# Patient Record
Sex: Female | Born: 1940 | Race: White | Hispanic: No | Smoking: Never smoker
Health system: Southern US, Community
[De-identification: ages and names within clinical notes are randomized; demographics above are authoritative.]

## PROBLEM LIST (undated history)

## (undated) DIAGNOSIS — I10 Essential (primary) hypertension: Secondary | ICD-10-CM

## (undated) DIAGNOSIS — E079 Disorder of thyroid, unspecified: Secondary | ICD-10-CM

## (undated) DIAGNOSIS — E785 Hyperlipidemia, unspecified: Secondary | ICD-10-CM

## (undated) HISTORY — PX: BLADDER REPAIR: SHX76

## (undated) HISTORY — DX: Essential (primary) hypertension: I10

## (undated) HISTORY — PX: ABDOMINAL HYSTERECTOMY: SHX81

## (undated) HISTORY — DX: Hyperlipidemia, unspecified: E78.5

## (undated) HISTORY — DX: Disorder of thyroid, unspecified: E07.9

---

## 1998-07-13 ENCOUNTER — Other Ambulatory Visit: Admission: RE | Admit: 1998-07-13 | Discharge: 1998-07-13 | Payer: Self-pay | Admitting: Obstetrics and Gynecology

## 1999-07-27 ENCOUNTER — Other Ambulatory Visit: Admission: RE | Admit: 1999-07-27 | Discharge: 1999-07-27 | Payer: Self-pay | Admitting: Obstetrics and Gynecology

## 2000-09-04 ENCOUNTER — Other Ambulatory Visit: Admission: RE | Admit: 2000-09-04 | Discharge: 2000-09-04 | Payer: Self-pay | Admitting: Obstetrics and Gynecology

## 2001-09-08 ENCOUNTER — Encounter: Payer: Self-pay | Admitting: Obstetrics and Gynecology

## 2001-09-08 ENCOUNTER — Ambulatory Visit (HOSPITAL_COMMUNITY): Admission: RE | Admit: 2001-09-08 | Discharge: 2001-09-08 | Payer: Self-pay | Admitting: Obstetrics and Gynecology

## 2001-11-14 ENCOUNTER — Other Ambulatory Visit: Admission: RE | Admit: 2001-11-14 | Discharge: 2001-11-14 | Payer: Self-pay | Admitting: Obstetrics and Gynecology

## 2002-11-16 ENCOUNTER — Ambulatory Visit (HOSPITAL_COMMUNITY): Admission: RE | Admit: 2002-11-16 | Discharge: 2002-11-16 | Payer: Self-pay | Admitting: Obstetrics and Gynecology

## 2002-11-16 ENCOUNTER — Encounter: Payer: Self-pay | Admitting: Obstetrics and Gynecology

## 2002-12-16 ENCOUNTER — Other Ambulatory Visit: Admission: RE | Admit: 2002-12-16 | Discharge: 2002-12-16 | Payer: Self-pay | Admitting: Obstetrics and Gynecology

## 2003-12-14 ENCOUNTER — Ambulatory Visit (HOSPITAL_COMMUNITY): Admission: RE | Admit: 2003-12-14 | Discharge: 2003-12-14 | Payer: Self-pay | Admitting: Obstetrics and Gynecology

## 2005-02-15 ENCOUNTER — Ambulatory Visit (HOSPITAL_COMMUNITY): Admission: RE | Admit: 2005-02-15 | Discharge: 2005-02-15 | Payer: Self-pay | Admitting: Obstetrics and Gynecology

## 2006-03-21 ENCOUNTER — Ambulatory Visit (HOSPITAL_COMMUNITY): Admission: RE | Admit: 2006-03-21 | Discharge: 2006-03-21 | Payer: Self-pay | Admitting: Obstetrics and Gynecology

## 2007-07-21 ENCOUNTER — Ambulatory Visit (HOSPITAL_COMMUNITY): Admission: RE | Admit: 2007-07-21 | Discharge: 2007-07-21 | Payer: Self-pay | Admitting: Obstetrics and Gynecology

## 2008-07-27 ENCOUNTER — Ambulatory Visit (HOSPITAL_COMMUNITY): Admission: RE | Admit: 2008-07-27 | Discharge: 2008-07-27 | Payer: Self-pay | Admitting: Obstetrics and Gynecology

## 2009-04-05 ENCOUNTER — Encounter (INDEPENDENT_AMBULATORY_CARE_PROVIDER_SITE_OTHER): Payer: Self-pay | Admitting: *Deleted

## 2009-06-21 ENCOUNTER — Ambulatory Visit: Payer: Self-pay | Admitting: Gastroenterology

## 2009-07-05 ENCOUNTER — Encounter: Payer: Self-pay | Admitting: Gastroenterology

## 2009-07-05 ENCOUNTER — Ambulatory Visit: Payer: Self-pay | Admitting: Gastroenterology

## 2009-07-06 ENCOUNTER — Encounter: Payer: Self-pay | Admitting: Gastroenterology

## 2009-08-02 ENCOUNTER — Ambulatory Visit (HOSPITAL_COMMUNITY): Admission: RE | Admit: 2009-08-02 | Discharge: 2009-08-02 | Payer: Self-pay | Admitting: Obstetrics and Gynecology

## 2010-08-03 ENCOUNTER — Ambulatory Visit (HOSPITAL_COMMUNITY)
Admission: RE | Admit: 2010-08-03 | Discharge: 2010-08-03 | Payer: Self-pay | Source: Home / Self Care | Attending: Obstetrics and Gynecology | Admitting: Obstetrics and Gynecology

## 2011-04-17 ENCOUNTER — Encounter (INDEPENDENT_AMBULATORY_CARE_PROVIDER_SITE_OTHER): Payer: Medicare Other | Admitting: Ophthalmology

## 2011-04-17 DIAGNOSIS — H43819 Vitreous degeneration, unspecified eye: Secondary | ICD-10-CM

## 2011-04-17 DIAGNOSIS — D3130 Benign neoplasm of unspecified choroid: Secondary | ICD-10-CM

## 2011-04-17 DIAGNOSIS — H2510 Age-related nuclear cataract, unspecified eye: Secondary | ICD-10-CM

## 2011-07-16 ENCOUNTER — Other Ambulatory Visit (HOSPITAL_COMMUNITY): Payer: Self-pay | Admitting: Obstetrics and Gynecology

## 2011-07-16 DIAGNOSIS — Z139 Encounter for screening, unspecified: Secondary | ICD-10-CM

## 2011-08-06 ENCOUNTER — Ambulatory Visit (HOSPITAL_COMMUNITY)
Admission: RE | Admit: 2011-08-06 | Discharge: 2011-08-06 | Disposition: A | Discharge status: Home / Self Care | Payer: Medicare Other | Source: Ambulatory Visit | Attending: Obstetrics and Gynecology | Admitting: Obstetrics and Gynecology

## 2011-08-06 DIAGNOSIS — Z1231 Encounter for screening mammogram for malignant neoplasm of breast: Secondary | ICD-10-CM | POA: Insufficient documentation

## 2011-08-06 DIAGNOSIS — Z139 Encounter for screening, unspecified: Secondary | ICD-10-CM

## 2011-10-18 ENCOUNTER — Ambulatory Visit (INDEPENDENT_AMBULATORY_CARE_PROVIDER_SITE_OTHER): Payer: Medicare Other | Admitting: Ophthalmology

## 2011-10-18 DIAGNOSIS — H2510 Age-related nuclear cataract, unspecified eye: Secondary | ICD-10-CM

## 2011-10-18 DIAGNOSIS — D3130 Benign neoplasm of unspecified choroid: Secondary | ICD-10-CM

## 2011-10-18 DIAGNOSIS — H43819 Vitreous degeneration, unspecified eye: Secondary | ICD-10-CM

## 2012-07-01 ENCOUNTER — Other Ambulatory Visit (HOSPITAL_COMMUNITY): Payer: Self-pay | Admitting: Obstetrics and Gynecology

## 2012-07-01 DIAGNOSIS — IMO0001 Reserved for inherently not codable concepts without codable children: Secondary | ICD-10-CM

## 2012-08-07 ENCOUNTER — Ambulatory Visit (HOSPITAL_COMMUNITY): Payer: Medicare Other

## 2012-08-12 ENCOUNTER — Ambulatory Visit (HOSPITAL_COMMUNITY): Payer: Medicare Other

## 2012-08-15 ENCOUNTER — Ambulatory Visit (HOSPITAL_COMMUNITY)
Admission: RE | Admit: 2012-08-15 | Discharge: 2012-08-15 | Disposition: A | Discharge status: Home / Self Care | Payer: Medicare Other | Source: Ambulatory Visit | Attending: Obstetrics and Gynecology | Admitting: Obstetrics and Gynecology

## 2012-08-15 DIAGNOSIS — Z1231 Encounter for screening mammogram for malignant neoplasm of breast: Secondary | ICD-10-CM | POA: Insufficient documentation

## 2012-08-15 DIAGNOSIS — IMO0001 Reserved for inherently not codable concepts without codable children: Secondary | ICD-10-CM

## 2012-10-17 ENCOUNTER — Ambulatory Visit (INDEPENDENT_AMBULATORY_CARE_PROVIDER_SITE_OTHER): Payer: BC Managed Care – PPO | Admitting: Ophthalmology

## 2013-07-13 ENCOUNTER — Other Ambulatory Visit (HOSPITAL_COMMUNITY): Payer: Self-pay | Admitting: Obstetrics and Gynecology

## 2013-07-13 DIAGNOSIS — Z139 Encounter for screening, unspecified: Secondary | ICD-10-CM

## 2013-08-17 ENCOUNTER — Ambulatory Visit (HOSPITAL_COMMUNITY)
Admission: RE | Admit: 2013-08-17 | Discharge: 2013-08-17 | Disposition: A | Discharge status: Home / Self Care | Payer: Medicare Other | Source: Ambulatory Visit | Attending: Obstetrics and Gynecology | Admitting: Obstetrics and Gynecology

## 2013-08-17 DIAGNOSIS — Z1231 Encounter for screening mammogram for malignant neoplasm of breast: Secondary | ICD-10-CM | POA: Insufficient documentation

## 2013-08-17 DIAGNOSIS — Z139 Encounter for screening, unspecified: Secondary | ICD-10-CM

## 2013-08-29 DIAGNOSIS — D3131 Benign neoplasm of right choroid: Secondary | ICD-10-CM | POA: Insufficient documentation

## 2013-10-19 ENCOUNTER — Ambulatory Visit (INDEPENDENT_AMBULATORY_CARE_PROVIDER_SITE_OTHER): Payer: Medicare Other | Admitting: Ophthalmology

## 2013-10-19 DIAGNOSIS — H43819 Vitreous degeneration, unspecified eye: Secondary | ICD-10-CM

## 2013-10-19 DIAGNOSIS — D3130 Benign neoplasm of unspecified choroid: Secondary | ICD-10-CM

## 2013-10-19 DIAGNOSIS — H2510 Age-related nuclear cataract, unspecified eye: Secondary | ICD-10-CM

## 2014-10-19 ENCOUNTER — Ambulatory Visit (INDEPENDENT_AMBULATORY_CARE_PROVIDER_SITE_OTHER): Payer: Medicare Other | Admitting: Ophthalmology

## 2014-10-19 DIAGNOSIS — D3131 Benign neoplasm of right choroid: Secondary | ICD-10-CM | POA: Diagnosis not present

## 2014-10-19 DIAGNOSIS — H43813 Vitreous degeneration, bilateral: Secondary | ICD-10-CM

## 2014-10-28 ENCOUNTER — Other Ambulatory Visit: Payer: Self-pay | Admitting: Obstetrics and Gynecology

## 2014-10-28 DIAGNOSIS — Z1231 Encounter for screening mammogram for malignant neoplasm of breast: Secondary | ICD-10-CM

## 2014-11-01 ENCOUNTER — Ambulatory Visit (HOSPITAL_COMMUNITY)
Admission: RE | Admit: 2014-11-01 | Discharge: 2014-11-01 | Disposition: A | Discharge status: Home / Self Care | Payer: Medicare Other | Source: Ambulatory Visit | Attending: Obstetrics and Gynecology | Admitting: Obstetrics and Gynecology

## 2014-11-01 DIAGNOSIS — Z1231 Encounter for screening mammogram for malignant neoplasm of breast: Secondary | ICD-10-CM | POA: Diagnosis present

## 2015-10-20 ENCOUNTER — Ambulatory Visit (INDEPENDENT_AMBULATORY_CARE_PROVIDER_SITE_OTHER): Payer: Medicare Other | Admitting: Ophthalmology

## 2015-11-10 ENCOUNTER — Ambulatory Visit (INDEPENDENT_AMBULATORY_CARE_PROVIDER_SITE_OTHER): Payer: Medicare Other | Admitting: Ophthalmology

## 2015-11-10 DIAGNOSIS — H43813 Vitreous degeneration, bilateral: Secondary | ICD-10-CM

## 2015-11-10 DIAGNOSIS — H2513 Age-related nuclear cataract, bilateral: Secondary | ICD-10-CM | POA: Diagnosis not present

## 2015-11-10 DIAGNOSIS — D3131 Benign neoplasm of right choroid: Secondary | ICD-10-CM

## 2015-11-18 ENCOUNTER — Other Ambulatory Visit (HOSPITAL_COMMUNITY): Payer: Self-pay | Admitting: Obstetrics and Gynecology

## 2015-11-18 DIAGNOSIS — Z1231 Encounter for screening mammogram for malignant neoplasm of breast: Secondary | ICD-10-CM

## 2015-11-24 ENCOUNTER — Ambulatory Visit (HOSPITAL_COMMUNITY)
Admission: RE | Admit: 2015-11-24 | Discharge: 2015-11-24 | Disposition: A | Discharge status: Home / Self Care | Payer: Medicare Other | Source: Ambulatory Visit | Attending: Obstetrics and Gynecology | Admitting: Obstetrics and Gynecology

## 2015-11-24 DIAGNOSIS — Z1231 Encounter for screening mammogram for malignant neoplasm of breast: Secondary | ICD-10-CM | POA: Diagnosis not present

## 2015-12-05 DIAGNOSIS — E039 Hypothyroidism, unspecified: Secondary | ICD-10-CM | POA: Diagnosis not present

## 2015-12-05 DIAGNOSIS — Z01419 Encounter for gynecological examination (general) (routine) without abnormal findings: Secondary | ICD-10-CM | POA: Diagnosis not present

## 2015-12-05 DIAGNOSIS — Z6832 Body mass index (BMI) 32.0-32.9, adult: Secondary | ICD-10-CM | POA: Diagnosis not present

## 2015-12-05 DIAGNOSIS — R5383 Other fatigue: Secondary | ICD-10-CM | POA: Diagnosis not present

## 2016-01-25 ENCOUNTER — Encounter: Payer: Self-pay | Admitting: Gastroenterology

## 2016-02-27 DIAGNOSIS — E039 Hypothyroidism, unspecified: Secondary | ICD-10-CM | POA: Diagnosis not present

## 2016-07-12 DIAGNOSIS — D3131 Benign neoplasm of right choroid: Secondary | ICD-10-CM | POA: Diagnosis not present

## 2016-11-13 ENCOUNTER — Ambulatory Visit (INDEPENDENT_AMBULATORY_CARE_PROVIDER_SITE_OTHER): Payer: Medicare Other | Admitting: Ophthalmology

## 2016-12-03 ENCOUNTER — Other Ambulatory Visit (HOSPITAL_COMMUNITY): Payer: Self-pay | Admitting: Obstetrics and Gynecology

## 2016-12-03 DIAGNOSIS — Z1231 Encounter for screening mammogram for malignant neoplasm of breast: Secondary | ICD-10-CM

## 2016-12-05 ENCOUNTER — Ambulatory Visit (HOSPITAL_COMMUNITY)
Admission: RE | Admit: 2016-12-05 | Discharge: 2016-12-05 | Disposition: A | Discharge status: Home / Self Care | Payer: Medicare Other | Source: Ambulatory Visit | Attending: Obstetrics and Gynecology | Admitting: Obstetrics and Gynecology

## 2016-12-05 DIAGNOSIS — Z1231 Encounter for screening mammogram for malignant neoplasm of breast: Secondary | ICD-10-CM | POA: Insufficient documentation

## 2016-12-17 DIAGNOSIS — M8588 Other specified disorders of bone density and structure, other site: Secondary | ICD-10-CM | POA: Diagnosis not present

## 2016-12-17 DIAGNOSIS — Z01419 Encounter for gynecological examination (general) (routine) without abnormal findings: Secondary | ICD-10-CM | POA: Diagnosis not present

## 2016-12-17 DIAGNOSIS — E039 Hypothyroidism, unspecified: Secondary | ICD-10-CM | POA: Diagnosis not present

## 2016-12-17 DIAGNOSIS — N958 Other specified menopausal and perimenopausal disorders: Secondary | ICD-10-CM | POA: Diagnosis not present

## 2016-12-17 DIAGNOSIS — Z6831 Body mass index (BMI) 31.0-31.9, adult: Secondary | ICD-10-CM | POA: Diagnosis not present

## 2017-01-31 ENCOUNTER — Ambulatory Visit (INDEPENDENT_AMBULATORY_CARE_PROVIDER_SITE_OTHER): Payer: Medicare Other | Admitting: Ophthalmology

## 2017-01-31 DIAGNOSIS — H43813 Vitreous degeneration, bilateral: Secondary | ICD-10-CM | POA: Diagnosis not present

## 2017-01-31 DIAGNOSIS — H2513 Age-related nuclear cataract, bilateral: Secondary | ICD-10-CM | POA: Diagnosis not present

## 2017-01-31 DIAGNOSIS — D3131 Benign neoplasm of right choroid: Secondary | ICD-10-CM | POA: Diagnosis not present

## 2017-08-09 DIAGNOSIS — H11002 Unspecified pterygium of left eye: Secondary | ICD-10-CM | POA: Diagnosis not present

## 2017-08-09 DIAGNOSIS — H2513 Age-related nuclear cataract, bilateral: Secondary | ICD-10-CM | POA: Diagnosis not present

## 2017-08-09 DIAGNOSIS — D3131 Benign neoplasm of right choroid: Secondary | ICD-10-CM | POA: Diagnosis not present

## 2017-12-26 ENCOUNTER — Other Ambulatory Visit (HOSPITAL_COMMUNITY): Payer: Self-pay | Admitting: Obstetrics and Gynecology

## 2017-12-26 DIAGNOSIS — Z1231 Encounter for screening mammogram for malignant neoplasm of breast: Secondary | ICD-10-CM

## 2017-12-27 ENCOUNTER — Ambulatory Visit (HOSPITAL_COMMUNITY)
Admission: RE | Admit: 2017-12-27 | Discharge: 2017-12-27 | Disposition: A | Discharge status: Home / Self Care | Payer: Medicare Other | Source: Ambulatory Visit | Attending: Obstetrics and Gynecology | Admitting: Obstetrics and Gynecology

## 2017-12-27 DIAGNOSIS — Z1231 Encounter for screening mammogram for malignant neoplasm of breast: Secondary | ICD-10-CM | POA: Insufficient documentation

## 2018-01-08 DIAGNOSIS — N3945 Continuous leakage: Secondary | ICD-10-CM | POA: Diagnosis not present

## 2018-01-08 DIAGNOSIS — Z01419 Encounter for gynecological examination (general) (routine) without abnormal findings: Secondary | ICD-10-CM | POA: Diagnosis not present

## 2018-01-08 DIAGNOSIS — Z1329 Encounter for screening for other suspected endocrine disorder: Secondary | ICD-10-CM | POA: Diagnosis not present

## 2018-01-08 DIAGNOSIS — Z6831 Body mass index (BMI) 31.0-31.9, adult: Secondary | ICD-10-CM | POA: Diagnosis not present

## 2018-01-08 DIAGNOSIS — E039 Hypothyroidism, unspecified: Secondary | ICD-10-CM | POA: Diagnosis not present

## 2018-01-13 ENCOUNTER — Telehealth: Payer: Self-pay | Admitting: *Deleted

## 2018-01-13 DIAGNOSIS — Z13220 Encounter for screening for lipoid disorders: Secondary | ICD-10-CM

## 2018-01-13 DIAGNOSIS — Z79899 Other long term (current) drug therapy: Secondary | ICD-10-CM

## 2018-01-13 NOTE — Telephone Encounter (Signed)
Patient is aware 

## 2018-01-13 NOTE — Addendum Note (Signed)
Addended by: Karle Barr on: 01/13/2018 02:15 PM   Modules accepted: Orders

## 2018-01-13 NOTE — Telephone Encounter (Signed)
Met 7 lip liv 

## 2018-01-13 NOTE — Telephone Encounter (Signed)
I spoke with the pt and she states she has had some blood pressure readings around 150/90 and she is having some headaches as of late. She was given the choice of being seen this week on Wednesday,but passed on that as her husband has an appt that day. She is scheduled on next Tuesday as that would work best for her. She also wants blood work drawn prior to this appt. No labs were drawn on her in the past with [place].Please advise.

## 2018-01-13 NOTE — Telephone Encounter (Signed)
I tried calling home husband [employer] says she is not there. I called her cell number and left a message to ask that she r/c to see what the bp are running and if headaches or symptomatic.No blood work has been drawn on her in the past.

## 2018-01-13 NOTE — Telephone Encounter (Signed)
Patient called stated she seen her GYN last week and he told her to schedule an office visit for high blood pressure patient scheduled for next Tuesday due to her husband having infusions this week. Patient stated she was also told to have blood work done such as cholesterol and blood sugar, patient stated it has been a while since labs were checked. Pease advise

## 2018-01-16 DIAGNOSIS — Z79899 Other long term (current) drug therapy: Secondary | ICD-10-CM | POA: Diagnosis not present

## 2018-01-16 DIAGNOSIS — Z13220 Encounter for screening for lipoid disorders: Secondary | ICD-10-CM | POA: Diagnosis not present

## 2018-01-17 LAB — BASIC METABOLIC PANEL
BUN/Creatinine Ratio: 14 (ref 12–28)
BUN: 11 mg/dL (ref 8–27)
CO2: 26 mmol/L (ref 20–29)
Calcium: 9.6 mg/dL (ref 8.7–10.3)
Chloride: 100 mmol/L (ref 96–106)
Creatinine, Ser: 0.81 mg/dL (ref 0.57–1.00)
GFR calc Af Amer: 83 mL/min/{1.73_m2} (ref >59)
GFR calc non Af Amer: 72 mL/min/{1.73_m2} (ref >59)
GLUCOSE: 120 mg/dL — AB (ref 65–99)
POTASSIUM: 5.3 mmol/L — AB (ref 3.5–5.2)
SODIUM: 141 mmol/L (ref 134–144)

## 2018-01-17 LAB — HEPATIC FUNCTION PANEL
ALT: 16 IU/L (ref 0–32)
AST: 14 IU/L (ref 0–40)
Albumin: 4.2 g/dL (ref 3.5–4.8)
Alkaline Phosphatase: 93 IU/L (ref 39–117)
BILIRUBIN TOTAL: 0.4 mg/dL (ref 0.0–1.2)
Bilirubin, Direct: 0.1 mg/dL (ref 0.00–0.40)
Total Protein: 5.9 g/dL — ABNORMAL LOW (ref 6.0–8.5)

## 2018-01-17 LAB — LIPID PANEL
CHOLESTEROL TOTAL: 214 mg/dL — AB (ref 100–199)
Chol/HDL Ratio: 4.7 ratio — ABNORMAL HIGH (ref 0.0–4.4)
HDL: 46 mg/dL (ref >39)
LDL Calculated: 132 mg/dL — ABNORMAL HIGH (ref 0–99)
TRIGLYCERIDES: 178 mg/dL — AB (ref 0–149)
VLDL Cholesterol Cal: 36 mg/dL (ref 5–40)

## 2018-01-21 ENCOUNTER — Ambulatory Visit (INDEPENDENT_AMBULATORY_CARE_PROVIDER_SITE_OTHER): Payer: Medicare Other | Admitting: Family Medicine

## 2018-01-21 ENCOUNTER — Encounter: Payer: Self-pay | Admitting: Family Medicine

## 2018-01-21 DIAGNOSIS — E785 Hyperlipidemia, unspecified: Secondary | ICD-10-CM

## 2018-01-21 DIAGNOSIS — I10 Essential (primary) hypertension: Secondary | ICD-10-CM

## 2018-01-21 DIAGNOSIS — E039 Hypothyroidism, unspecified: Secondary | ICD-10-CM

## 2018-01-21 MED ORDER — ENALAPRIL MALEATE 5 MG PO TABS: ORAL_TABLET | ORAL | 1 refills | Status: DC

## 2018-01-21 NOTE — Patient Instructions (Signed)
[facility] omron 5 fairly accurate  [employer] lifesource   Both of these monitors are good   High Cholesterol High cholesterol is a condition in which the blood has high levels of a white, waxy, fat-like substance (cholesterol). The human body needs small amounts of cholesterol. The liver makes all the cholesterol that the body needs. Extra (excess) cholesterol comes from the food that we eat. Cholesterol is carried from the liver by the blood through the blood vessels. If you have high cholesterol, deposits (plaques) may build up on the walls of your blood vessels (arteries). Plaques make the arteries narrower and stiffer. Cholesterol plaques increase your risk for heart attack and stroke. Work with your health care provider to keep your cholesterol levels in a healthy range. What increases the risk? This condition is more likely to develop in people who:  Eat foods that are high in animal fat (saturated fat) or cholesterol.  Are overweight.  Are not getting enough exercise.  Have a family history of high cholesterol.  What are the signs or symptoms? There are no symptoms of this condition. How is this diagnosed? This condition may be diagnosed from the results of a blood test.  If you are older than age 30, your health care provider may check your cholesterol every 4-6 years.  You may be checked more often if you already have high cholesterol or other risk factors for heart disease.  The blood test for cholesterol measures:  "Bad" cholesterol (LDL cholesterol). This is the main type of cholesterol that causes heart disease. The desired level for LDL is less than 100.  "Good" cholesterol (HDL cholesterol). This type helps to protect against heart disease by cleaning the arteries and carrying the LDL away. The desired level for HDL is 60 or higher.  Triglycerides. These are fats that the body can store or burn for energy. The desired number for triglycerides is lower than  150.  Total cholesterol. This is a measure of the total amount of cholesterol in your blood, including LDL cholesterol, HDL cholesterol, and triglycerides. A healthy number is less than 200.  How is this treated? This condition is treated with diet changes, lifestyle changes, and medicines. Diet changes  This may include eating more whole grains, fruits, vegetables, nuts, and fish.  This may also include cutting back on red meat and foods that have a lot of added sugar. Lifestyle changes  Changes may include getting at least 40 minutes of aerobic exercise 3 times a week. Aerobic exercises include walking, biking, and swimming. Aerobic exercise along with a healthy diet can help you maintain a healthy weight.  Changes may also include quitting smoking. Medicines  Medicines are usually given if diet and lifestyle changes have failed to reduce your cholesterol to healthy levels.  Your health care provider may prescribe a statin medicine. Statin medicines have been shown to reduce cholesterol, which can reduce the risk of heart disease. Follow these instructions at home: Eating and drinking  If told by your health care provider:  Eat chicken (without skin), fish, veal, shellfish, ground [place] breast, and round or loin cuts of red meat.  Do not eat fried foods or fatty meats, such as hot dogs and salami.  Eat plenty of fruits, such as apples.  Eat plenty of vegetables, such as broccoli, potatoes, and carrots.  Eat beans, peas, and lentils.  Eat grains such as barley, rice, couscous, and bulgur wheat.  Eat pasta without cream sauces.  Use skim or nonfat  milk, and eat low-fat or nonfat yogurt and cheeses.  Do not eat or drink whole milk, cream, ice cream, egg yolks, or hard cheeses.  Do not eat stick margarine or tub margarines that contain trans fats (also called partially hydrogenated oils).  Do not eat saturated tropical oils, such as coconut oil and palm oil.  Do not eat  cakes, cookies, crackers, or other baked goods that contain trans fats.  General instructions  Exercise as directed by your health care provider. Increase your activity level with activities such as gardening, walking, and taking the stairs.  Take over-the-counter and prescription medicines only as told by your health care provider.  Do not use any products that contain nicotine or tobacco, such as cigarettes and e-cigarettes. If you need help quitting, ask your health care provider.  Keep all follow-up visits as told by your health care provider. This is important. Contact a health care provider if:  You are struggling to maintain a healthy diet or weight.  You need help to start on an exercise program.  You need help to stop smoking. Get help right away if:  You have chest pain.  You have trouble breathing. This information is not intended to replace advice given to you by your health care provider. Make sure you discuss any questions you have with your health care provider. Document Released: 08/13/2005 Document Revised: 03/10/2016 Document Reviewed: 02/11/2016 Elsevier Interactive Patient Education  2018 [employer].  Hypertension Hypertension, commonly called high blood pressure, is when the force of blood pumping through the arteries is too strong. The arteries are the blood vessels that carry blood from the heart throughout the body. Hypertension forces the heart to work harder to pump blood and may cause arteries to become narrow or stiff. Having untreated or uncontrolled hypertension can cause heart attacks, strokes, kidney disease, and other problems. A blood pressure reading consists of a higher number over a lower number. Ideally, your blood pressure should be below 120/80. The first ("top") number is called the systolic pressure. It is a measure of the pressure in your arteries as your heart beats. The second ("bottom") number is called the diastolic pressure. It is a  measure of the pressure in your arteries as the heart relaxes. What are the causes? The cause of this condition is not known. What increases the risk? Some risk factors for high blood pressure are under your control. Others are not. Factors you can change  Smoking.  Having type 2 diabetes mellitus, high cholesterol, or both.  Not getting enough exercise or physical activity.  Being overweight.  Having too much fat, sugar, calories, or salt (sodium) in your diet.  Drinking too much alcohol. Factors that are difficult or impossible to change  Having chronic kidney disease.  Having a family history of high blood pressure.  Age. Risk increases with age.  Race. You may be at higher risk if you are African-American.  Gender. Men are at higher risk than women before age 62. After age 68, women are at higher risk than men.  Having obstructive sleep apnea.  Stress. What are the signs or symptoms? Extremely high blood pressure (hypertensive crisis) may cause:  Headache.  Anxiety.  Shortness of breath.  Nosebleed.  Nausea and vomiting.  Severe chest pain.  Jerky movements you cannot control (seizures).  How is this diagnosed? This condition is diagnosed by measuring your blood pressure while you are seated, with your arm resting on a surface. The cuff of the blood  pressure monitor will be placed directly against the skin of your upper arm at the level of your heart. It should be measured at least twice using the same arm. Certain conditions can cause a difference in blood pressure between your right and left arms. Certain factors can cause blood pressure readings to be lower or higher than normal (elevated) for a short period of time:  When your blood pressure is higher when you are in a health care provider's office than when you are at home, this is called white coat hypertension. Most people with this condition do not need medicines.  When your blood pressure is higher  at home than when you are in a health care provider's office, this is called masked hypertension. Most people with this condition may need medicines to control blood pressure.  If you have a high blood pressure reading during one visit or you have normal blood pressure with other risk factors:  You may be asked to return on a different day to have your blood pressure checked again.  You may be asked to monitor your blood pressure at home for 1 week or longer.  If you are diagnosed with hypertension, you may have other blood or imaging tests to help your health care provider understand your overall risk for other conditions. How is this treated? This condition is treated by making healthy lifestyle changes, such as eating healthy foods, exercising more, and reducing your alcohol intake. Your health care provider may prescribe medicine if lifestyle changes are not enough to get your blood pressure under control, and if:  Your systolic blood pressure is above 130.  Your diastolic blood pressure is above 80.  Your personal target blood pressure may vary depending on your medical conditions, your age, and other factors. Follow these instructions at home: Eating and drinking  Eat a diet that is high in fiber and potassium, and low in sodium, added sugar, and fat. An example eating plan is called the DASH (Dietary Approaches to Stop Hypertension) diet. To eat this way: ? Eat plenty of fresh fruits and vegetables. Try to fill half of your plate at each meal with fruits and vegetables. ? Eat whole grains, such as whole wheat pasta, brown rice, or whole grain bread. Fill about one quarter of your plate with whole grains. ? Eat or drink low-fat dairy products, such as skim milk or low-fat yogurt. ? Avoid fatty cuts of meat, processed or cured meats, and poultry with skin. Fill about one quarter of your plate with lean proteins, such as fish, chicken without skin, beans, eggs, and tofu. ? Avoid premade  and processed foods. These tend to be higher in sodium, added sugar, and fat.  Reduce your daily sodium intake. Most people with hypertension should eat less than 1,500 mg of sodium a day.  Limit alcohol intake to no more than 1 drink a day for nonpregnant women and 2 drinks a day for men. One drink equals 12 oz of beer, 5 oz of wine, or 1 oz of hard liquor. Lifestyle  Work with your health care provider to maintain a healthy body weight or to lose weight. Ask what an ideal weight is for you.  Get at least 30 minutes of exercise that causes your heart to beat faster (aerobic exercise) most days of the week. Activities may include walking, swimming, or biking.  Include exercise to strengthen your muscles (resistance exercise), such as pilates or lifting weights, as part of your weekly exercise routine.  Try to do these types of exercises for 30 minutes at least 3 days a week.  Do not use any products that contain nicotine or tobacco, such as cigarettes and e-cigarettes. If you need help quitting, ask your health care provider.  Monitor your blood pressure at home as told by your health care provider.  Keep all follow-up visits as told by your health care provider. This is important. Medicines  Take over-the-counter and prescription medicines only as told by your health care provider. Follow directions carefully. Blood pressure medicines must be taken as prescribed.  Do not skip doses of blood pressure medicine. Doing this puts you at risk for problems and can make the medicine less effective.  Ask your health care provider about side effects or reactions to medicines that you should watch for. Contact a health care provider if:  You think you are having a reaction to a medicine you are taking.  You have headaches that keep coming back (recurring).  You feel dizzy.  You have swelling in your ankles.  You have trouble with your vision. Get help right away if:  You develop a severe  headache or confusion.  You have unusual weakness or numbness.  You feel faint.  You have severe pain in your chest or abdomen.  You vomit repeatedly.  You have trouble breathing. Summary  Hypertension is when the force of blood pumping through your arteries is too strong. If this condition is not controlled, it may put you at risk for serious complications.  Your personal target blood pressure may vary depending on your medical conditions, your age, and other factors. For most people, a normal blood pressure is less than 120/80.  Hypertension is treated with lifestyle changes, medicines, or a combination of both. Lifestyle changes include weight loss, eating a healthy, low-sodium diet, exercising more, and limiting alcohol. This information is not intended to replace advice given to you by your health care provider. Make sure you discuss any questions you have with your health care provider. Document Released: 08/13/2005 Document Revised: 07/11/2016 Document Reviewed: 07/11/2016 Elsevier Interactive Patient Education  [employer].

## 2018-01-21 NOTE — Progress Notes (Signed)
   Subjective:    Patient ID: Meghan Ortiz, female    DOB: April 19, 1941, 77 y.o.   MRN: [id number] Patient arrives after a multi-year hiatus, she has been obtain all of her health care elsewhere with OB/GYN.  Hypertension   This is a new problem. Associated symptoms include headaches.  Pt has been taking care of husband for the past year. Pt seen OB and her BP was 150/90   BP up lately, in past numberw have been good  BP elev last winter   Pt can hear her heart beat at times in her head  No fam hx of high blood pressure  stong fam yrs   On effexor for stress and anxiety    Pt under a lot of stress sith husb sick and with lung cancer  Pt had exercised reg unitl pst yr beaus of theis    Bowels better, taking more awate into the diet    No hi b p issues in the past    110 three yrs ago   Review of Systems  Neurological: Positive for headaches.       Objective:   Physical Exam  Alert and oriented, vitals reviewed and stable, NAD ENT-TM's and ext canals WNL bilat via otoscopic exam Soft palate, tonsils and post pharynx WNL via oropharyngeal exam Neck-symmetric, no masses; thyroid nonpalpable and nontender Pulmonary-no tachypnea or accessory muscle use; Clear without wheezes via auscultation Card--no abnrml murmurs, rhythm reg and rate WNL Carotid pulses symmetric, without bruits       Assessment & Plan:  Impression hypertension.  Exacerbated by stress.  Long discussion held.  Time to get on with medication.  Choices for home monitoring discussed at length.  Blood work discussed.  Hyperlipidemia present.  Educational information given.  Diet exercise encouraged.  Certainly very challenging for patient with husband facing lung cancer and progressive dementia.  Certainly adding to blood pressure issues.  Follow-up as scheduled.     Greater than 50% of this 30 minute face to face visit was spent in counseling and discussion and coordination of care regarding the  above diagnosis/diagnosies

## 2018-01-22 DIAGNOSIS — E039 Hypothyroidism, unspecified: Secondary | ICD-10-CM | POA: Insufficient documentation

## 2018-01-22 DIAGNOSIS — E785 Hyperlipidemia, unspecified: Secondary | ICD-10-CM | POA: Insufficient documentation

## 2018-01-22 DIAGNOSIS — I10 Essential (primary) hypertension: Secondary | ICD-10-CM | POA: Insufficient documentation

## 2018-02-05 ENCOUNTER — Ambulatory Visit (INDEPENDENT_AMBULATORY_CARE_PROVIDER_SITE_OTHER): Payer: Medicare Other | Admitting: Ophthalmology

## 2018-02-05 DIAGNOSIS — H2513 Age-related nuclear cataract, bilateral: Secondary | ICD-10-CM | POA: Diagnosis not present

## 2018-02-05 DIAGNOSIS — H43813 Vitreous degeneration, bilateral: Secondary | ICD-10-CM

## 2018-02-05 DIAGNOSIS — D3131 Benign neoplasm of right choroid: Secondary | ICD-10-CM

## 2018-03-04 DIAGNOSIS — H04123 Dry eye syndrome of bilateral lacrimal glands: Secondary | ICD-10-CM | POA: Diagnosis not present

## 2018-03-04 DIAGNOSIS — H11002 Unspecified pterygium of left eye: Secondary | ICD-10-CM | POA: Diagnosis not present

## 2018-03-04 DIAGNOSIS — H01009 Unspecified blepharitis unspecified eye, unspecified eyelid: Secondary | ICD-10-CM | POA: Diagnosis not present

## 2018-03-20 ENCOUNTER — Encounter: Payer: Self-pay | Admitting: Family Medicine

## 2018-03-20 ENCOUNTER — Ambulatory Visit (INDEPENDENT_AMBULATORY_CARE_PROVIDER_SITE_OTHER): Payer: Medicare Other | Admitting: Family Medicine

## 2018-03-20 VITALS — BP 134/74 | Ht 63.5 in | Wt 177.4 lb

## 2018-03-20 DIAGNOSIS — R7303 Prediabetes: Secondary | ICD-10-CM | POA: Diagnosis not present

## 2018-03-20 DIAGNOSIS — E785 Hyperlipidemia, unspecified: Secondary | ICD-10-CM | POA: Diagnosis not present

## 2018-03-20 DIAGNOSIS — I10 Essential (primary) hypertension: Secondary | ICD-10-CM

## 2018-03-20 NOTE — Patient Instructions (Addendum)
[employer] bp monitoring systems 100 dolars Prediabetes Eating Plan Prediabetes-also called impaired glucose tolerance or impaired fasting glucose-is a condition that causes blood sugar (blood glucose) levels to be higher than normal. Following a healthy diet can help to keep prediabetes under control. It can also help to lower the risk of type 2 diabetes and heart disease, which are increased in people who have prediabetes. Along with regular exercise, a healthy diet:  Promotes weight loss.  Helps to control blood sugar levels.  Helps to improve the way that the body uses insulin.  What do I need to know about this eating plan?  Use the glycemic index (GI) to plan your meals. The index tells you how quickly a food will raise your blood sugar. Choose low-GI foods. These foods take a longer time to raise blood sugar.  Pay close attention to the amount of carbohydrates in the food that you eat. Carbohydrates increase blood sugar levels.  Keep track of how many calories you take in. Eating the right amount of calories will help you to achieve a healthy weight. Losing about 7 percent of your starting weight can help to prevent type 2 diabetes.  You may want to follow a Mediterranean diet. This diet includes a lot of vegetables, lean meats or fish, whole grains, fruits, and healthy oils and fats. What foods can I eat? Grains Whole grains, such as whole-wheat or whole-grain breads, crackers, cereals, and pasta. Unsweetened oatmeal. Bulgur. Barley. Quinoa. Brown rice. Corn or whole-wheat flour tortillas or taco shells. Vegetables Lettuce. Spinach. Peas. Beets. Cauliflower. Cabbage. Broccoli. Carrots. Tomatoes. Squash. Eggplant. Herbs. Peppers. Onions. Cucumbers. Brussels sprouts. Fruits Berries. Bananas. Apples. Oranges. Grapes. Papaya. Mango. Pomegranate. Kiwi. Grapefruit. Cherries. Meats and Other Protein Sources Seafood. Lean meats, such as chicken and [place] or lean cuts of pork and  beef. Tofu. Eggs. Nuts. Beans. Dairy Low-fat or fat-free dairy products, such as yogurt, cottage cheese, and cheese. Beverages Water. Tea. Coffee. Sugar-free or diet soda. Seltzer water. Milk. Milk alternatives, such as soy or almond milk. Condiments Mustard. Relish. Low-fat, low-sugar ketchup. Low-fat, low-sugar barbecue sauce. Low-fat or fat-free mayonnaise. Sweets and Desserts Sugar-free or low-fat pudding. Sugar-free or low-fat ice cream and other frozen treats. Fats and Oils Avocado. Walnuts. Olive oil. The items listed above may not be a complete list of recommended foods or beverages. Contact your dietitian for more options. What foods are not recommended? Grains Refined white flour and flour products, such as bread, pasta, snack foods, and cereals. Beverages Sweetened drinks, such as sweet iced tea and soda. Sweets and Desserts Baked goods, such as cake, cupcakes, pastries, cookies, and cheesecake. The items listed above may not be a complete list of foods and beverages to avoid. Contact your dietitian for more information. This information is not intended to replace advice given to you by your health care provider. Make sure you discuss any questions you have with your health care provider. Document Released: 12/28/2014 Document Revised: 01/19/2016 Document Reviewed: 09/08/2014 Elsevier Interactive Patient Education  2017 [employer].  High Cholesterol High cholesterol is a condition in which the blood has high levels of a white, waxy, fat-like substance (cholesterol). The human body needs small amounts of cholesterol. The liver makes all the cholesterol that the body needs. Extra (excess) cholesterol comes from the food that we eat. Cholesterol is carried from the liver by the blood through the blood vessels. If you have high cholesterol, deposits (plaques) may build up on the walls of your blood vessels (  arteries). Plaques make the arteries narrower and stiffer. Cholesterol  plaques increase your risk for heart attack and stroke. Work with your health care provider to keep your cholesterol levels in a healthy range. What increases the risk? This condition is more likely to develop in people who:  Eat foods that are high in animal fat (saturated fat) or cholesterol.  Are overweight.  Are not getting enough exercise.  Have a family history of high cholesterol.  What are the signs or symptoms? There are no symptoms of this condition. How is this diagnosed? This condition may be diagnosed from the results of a blood test.  If you are older than age 70, your health care provider may check your cholesterol every 4-6 years.  You may be checked more often if you already have high cholesterol or other risk factors for heart disease.  The blood test for cholesterol measures:  "Bad" cholesterol (LDL cholesterol). This is the main type of cholesterol that causes heart disease. The desired level for LDL is less than 100.  "Good" cholesterol (HDL cholesterol). This type helps to protect against heart disease by cleaning the arteries and carrying the LDL away. The desired level for HDL is 60 or higher.  Triglycerides. These are fats that the body can store or burn for energy. The desired number for triglycerides is lower than 150.  Total cholesterol. This is a measure of the total amount of cholesterol in your blood, including LDL cholesterol, HDL cholesterol, and triglycerides. A healthy number is less than 200.  How is this treated? This condition is treated with diet changes, lifestyle changes, and medicines. Diet changes  This may include eating more whole grains, fruits, vegetables, nuts, and fish.  This may also include cutting back on red meat and foods that have a lot of added sugar. Lifestyle changes  Changes may include getting at least 40 minutes of aerobic exercise 3 times a week. Aerobic exercises include walking, biking, and swimming. Aerobic  exercise along with a healthy diet can help you maintain a healthy weight.  Changes may also include quitting smoking. Medicines  Medicines are usually given if diet and lifestyle changes have failed to reduce your cholesterol to healthy levels.  Your health care provider may prescribe a statin medicine. Statin medicines have been shown to reduce cholesterol, which can reduce the risk of heart disease. Follow these instructions at home: Eating and drinking  If told by your health care provider:  Eat chicken (without skin), fish, veal, shellfish, ground [place] breast, and round or loin cuts of red meat.  Do not eat fried foods or fatty meats, such as hot dogs and salami.  Eat plenty of fruits, such as apples.  Eat plenty of vegetables, such as broccoli, potatoes, and carrots.  Eat beans, peas, and lentils.  Eat grains such as barley, rice, couscous, and bulgur wheat.  Eat pasta without cream sauces.  Use skim or nonfat milk, and eat low-fat or nonfat yogurt and cheeses.  Do not eat or drink whole milk, cream, ice cream, egg yolks, or hard cheeses.  Do not eat stick margarine or tub margarines that contain trans fats (also called partially hydrogenated oils).  Do not eat saturated tropical oils, such as coconut oil and palm oil.  Do not eat cakes, cookies, crackers, or other baked goods that contain trans fats.  General instructions  Exercise as directed by your health care provider. Increase your activity level with activities such as gardening, walking, and taking the  stairs.  Take over-the-counter and prescription medicines only as told by your health care provider.  Do not use any products that contain nicotine or tobacco, such as cigarettes and e-cigarettes. If you need help quitting, ask your health care provider.  Keep all follow-up visits as told by your health care provider. This is important. Contact a health care provider if:  You are struggling to maintain a  healthy diet or weight.  You need help to start on an exercise program.  You need help to stop smoking. Get help right away if:  You have chest pain.  You have trouble breathing. This information is not intended to replace advice given to you by your health care provider. Make sure you discuss any questions you have with your health care provider. Document Released: 08/13/2005 Document Revised: 03/10/2016 Document Reviewed: 02/11/2016 Elsevier Interactive Patient Education  [employer].

## 2018-03-20 NOTE — Progress Notes (Signed)
   Subjective:    Patient ID: Meghan Ortiz, female    DOB: 06-Apr-1941, 77 y.o.   MRN: [id number]  Hyperlipidemia  This is a chronic problem. There are no compliance problems.   Hypertension  This is a chronic problem. There are no compliance problems.    Pt here for 2 month follow up. Pt was seen in May for HTN. Pt has brought her home BP machine (152/76 in right arm). Pt takes BP at about same time each night.   BP numbers 130 over 65  Right 128 over 59   nunbers geernally good    Chol a bit on the high side in the past  Glucose 110 three yrs ago.  Patient aware sugar is initiated.  Review of Systems No headache, no major weight loss or weight gain, no chest pain no back pain abdominal pain no change in bowel habits complete ROS otherwise negative     Objective:   Physical Exam Alert vitals stable, NAD. Blood pressure good on repeat. HEENT normal. Lungs clear. Heart regular rate and rhythm.        Assessment & Plan:  Impression hypertension.  Good control.  Discussed.  To maintain same medication.  Hyperlipidemia discussed.  Educational information given.  Appropriate measures recommended.  3.  Elevated fasting sugar.  Now 120.  This constitutes prediabetes.  Discussed with patient.  4.  Stress.  Ongoing.  Husband has been quite sick with chronic illness and cancer  Follow-up in 6 months.  Maintain same meds.  Diet exercise discussed.  Work on diet also particularly hard compliance with meds encourage

## 2018-03-21 ENCOUNTER — Ambulatory Visit: Payer: Medicare Other | Admitting: Family Medicine

## 2018-06-04 DIAGNOSIS — H01009 Unspecified blepharitis unspecified eye, unspecified eyelid: Secondary | ICD-10-CM | POA: Diagnosis not present

## 2018-06-04 DIAGNOSIS — H04123 Dry eye syndrome of bilateral lacrimal glands: Secondary | ICD-10-CM | POA: Diagnosis not present

## 2018-06-04 DIAGNOSIS — H11002 Unspecified pterygium of left eye: Secondary | ICD-10-CM | POA: Diagnosis not present

## 2018-06-05 DIAGNOSIS — Z23 Encounter for immunization: Secondary | ICD-10-CM | POA: Diagnosis not present

## 2018-07-21 ENCOUNTER — Other Ambulatory Visit: Payer: Self-pay | Admitting: Family Medicine

## 2018-09-05 DIAGNOSIS — H11002 Unspecified pterygium of left eye: Secondary | ICD-10-CM | POA: Diagnosis not present

## 2018-09-05 DIAGNOSIS — D3131 Benign neoplasm of right choroid: Secondary | ICD-10-CM | POA: Diagnosis not present

## 2018-09-05 DIAGNOSIS — H2513 Age-related nuclear cataract, bilateral: Secondary | ICD-10-CM | POA: Diagnosis not present

## 2018-09-08 ENCOUNTER — Ambulatory Visit (INDEPENDENT_AMBULATORY_CARE_PROVIDER_SITE_OTHER): Payer: Medicare Other | Admitting: Family Medicine

## 2018-09-08 ENCOUNTER — Encounter: Payer: Self-pay | Admitting: Family Medicine

## 2018-09-08 VITALS — BP 132/72 | Temp 98.2°F | Ht 63.5 in | Wt 171.0 lb

## 2018-09-08 DIAGNOSIS — J019 Acute sinusitis, unspecified: Secondary | ICD-10-CM | POA: Diagnosis not present

## 2018-09-08 MED ORDER — AMOXICILLIN 500 MG PO CAPS: 500.0000 mg | ORAL_CAPSULE | Freq: Three times a day (TID) | ORAL | 0 refills | Status: AC

## 2018-09-08 MED ORDER — BENZONATATE 100 MG PO CAPS: 100.0000 mg | ORAL_CAPSULE | Freq: Three times a day (TID) | ORAL | 0 refills | Status: DC | PRN

## 2018-09-08 NOTE — Patient Instructions (Signed)
Sinusitis, Adult  Sinusitis is inflammation of your sinuses. Sinuses are hollow spaces in the bones around your face. Your sinuses are located:   Around your eyes.   In the middle of your forehead.   Behind your nose.   In your cheekbones.  Mucus normally drains out of your sinuses. When your nasal tissues become inflamed or swollen, mucus can become trapped or blocked. This allows bacteria, viruses, and fungi to grow, which leads to infection. Most infections of the sinuses are caused by a virus.  Sinusitis can develop quickly. It can last for up to 4 weeks (acute) or for more than 12 weeks (chronic). Sinusitis often develops after a cold.  What are the causes?  This condition is caused by anything that creates swelling in the sinuses or stops mucus from draining. This includes:   Allergies.   Asthma.   Infection from bacteria or viruses.   Deformities or blockages in your nose or sinuses.   Abnormal growths in the nose (nasal polyps).   Pollutants, such as chemicals or irritants in the air.   Infection from fungi (rare).  What increases the risk?  You are more likely to develop this condition if you:   Have a weak body defense system (immune system).   Do a lot of swimming or diving.   Overuse nasal sprays.   Smoke.  What are the signs or symptoms?  The main symptoms of this condition are pain and a feeling of pressure around the affected sinuses. Other symptoms include:   Stuffy nose or congestion.   Thick drainage from your nose.   Swelling and warmth over the affected sinuses.   Headache.   Upper toothache.   A cough that may get worse at night.   Extra mucus that collects in the throat or the back of the nose (postnasal drip).   Decreased sense of smell and taste.   Fatigue.   A fever.   Sore throat.   Bad breath.  How is this diagnosed?  This condition is diagnosed based on:   Your symptoms.   Your medical history.   A physical exam.   Tests to find out if your condition is  acute or chronic. This may include:  ? Checking your nose for nasal polyps.  ? Viewing your sinuses using a device that has a light (endoscope).  ? Testing for allergies or bacteria.  ? Imaging tests, such as an MRI or CT scan.  In rare cases, a bone biopsy may be done to rule out more serious types of fungal sinus disease.  How is this treated?  Treatment for sinusitis depends on the cause and whether your condition is chronic or acute.   If caused by a virus, your symptoms should go away on their own within 10 days. You may be given medicines to relieve symptoms. They include:  ? Medicines that shrink swollen nasal passages (topical intranasal decongestants).  ? Medicines that treat allergies (antihistamines).  ? A spray that eases inflammation of the nostrils (topical intranasal corticosteroids).  ? Rinses that help get rid of thick mucus in your nose (nasal saline washes).   If caused by bacteria, your health care provider may recommend waiting to see if your symptoms improve. Most bacterial infections will get better without antibiotic medicine. You may be given antibiotics if you have:  ? A severe infection.  ? A weak immune system.   If caused by narrow nasal passages or nasal polyps, you may need   to have surgery.  Follow these instructions at home:  Medicines   Take, use, or apply over-the-counter and prescription medicines only as told by your health care provider. These may include nasal sprays.   If you were prescribed an antibiotic medicine, take it as told by your health care provider. Do not stop taking the antibiotic even if you start to feel better.  Hydrate and humidify     Drink enough fluid to keep your urine pale yellow. Staying hydrated will help to thin your mucus.   Use a cool mist humidifier to keep the humidity level in your home above 50%.   Inhale steam for 10-15 minutes, 3-4 times a day, or as told by your health care provider. You can do this in the bathroom while a hot shower is  running.   Limit your exposure to cool or dry air.  Rest   Rest as much as possible.   Sleep with your head raised (elevated).   Make sure you get enough sleep each night.  General instructions     Apply a warm, moist washcloth to your face 3-4 times a day or as told by your health care provider. This will help with discomfort.   Wash your hands often with soap and water to reduce your exposure to germs. If soap and water are not available, use hand sanitizer.   Do not smoke. Avoid being around people who are smoking (secondhand smoke).   Keep all follow-up visits as told by your health care provider. This is important.  Contact a health care provider if:   You have a fever.   Your symptoms get worse.   Your symptoms do not improve within 10 days.  Get help right away if:   You have a severe headache.   You have persistent vomiting.   You have severe pain or swelling around your face or eyes.   You have vision problems.   You develop confusion.   Your neck is stiff.   You have trouble breathing.  Summary   Sinusitis is soreness and inflammation of your sinuses. Sinuses are hollow spaces in the bones around your face.   This condition is caused by nasal tissues that become inflamed or swollen. The swelling traps or blocks the flow of mucus. This allows bacteria, viruses, and fungi to grow, which leads to infection.   If you were prescribed an antibiotic medicine, take it as told by your health care provider. Do not stop taking the antibiotic even if you start to feel better.   Keep all follow-up visits as told by your health care provider. This is important.  This information is not intended to replace advice given to you by your health care provider. Make sure you discuss any questions you have with your health care provider.  Document Released: 08/13/2005 Document Revised: 01/13/2018 Document Reviewed: 01/13/2018  Elsevier Interactive Patient Education  2019 [employer] Inc[employer]

## 2018-09-08 NOTE — Progress Notes (Signed)
   Subjective:    Patient ID: Meghan Ortiz, female    DOB: 10/31/1940, 78 y.o.   MRN: [id number]  HPI Patient is here today with complaints of a productive  Cough,head and chest congestion,headache,runny nose,sore throat. Her husband passed away at the beginning of this year due to complications of Cancer.  She states she has been taking otc, cold and sinus,mucinex,Tylenol.  Reports 5 days ago had sinus pressure, took sinus medicine and thought it was getting better but has gotten worse. 3-4 days ago started with cough, productive of light green phlegm, nasal congestion. Sore throat has improved. No fever, a few chills, no body aches.    Review of Systems  Constitutional: Negative for fever.  HENT: Positive for congestion, sinus pressure and sore throat. Negative for ear discharge and ear pain.   Respiratory: Positive for cough. Negative for shortness of breath and wheezing.   Gastrointestinal: Negative for nausea and vomiting.  Musculoskeletal: Negative for myalgias.       Objective:   Physical Exam Vitals signs and nursing note reviewed.  Constitutional:      General: She is not in acute distress.    Appearance: She is not toxic-appearing.  HENT:     Head: Normocephalic and atraumatic.     Right Ear: Tympanic membrane normal.     Left Ear: Tympanic membrane normal.     Nose: Nasal tenderness and congestion present.     Mouth/Throat:     Mouth: Mucous membranes are moist.     Pharynx: Oropharynx is clear.  Eyes:     General:        Right eye: No discharge.        Left eye: No discharge.  Neck:     Musculoskeletal: Neck supple. No neck rigidity.  Cardiovascular:     Rate and Rhythm: Normal rate and regular rhythm.     Heart sounds: Normal heart sounds.  Pulmonary:     Effort: Pulmonary effort is normal. No respiratory distress.     Breath sounds: Normal breath sounds. No wheezing or rales.  Lymphadenopathy:     Cervical: No cervical adenopathy.  Skin:    General:  Skin is warm and dry.  Neurological:     Mental Status: She is alert and oriented to person, place, and time.           Assessment & Plan:  Acute rhinosinusitis  Discussed likely post-viral etiology. Will treat with amoxicillin x 10 days and benzonatate prn cough. Symptomatic care discussed. Warning signs discussed. F/u if symptoms worsen or fail to improve.   Dr. Mickie Hillier was consulted on this case and is in agreement with the above treatment plan.

## 2018-09-22 ENCOUNTER — Ambulatory Visit (INDEPENDENT_AMBULATORY_CARE_PROVIDER_SITE_OTHER): Payer: Medicare Other | Admitting: Family Medicine

## 2018-09-22 ENCOUNTER — Encounter: Payer: Self-pay | Admitting: Family Medicine

## 2018-09-22 VITALS — BP 128/82 | Ht 63.5 in | Wt 172.8 lb

## 2018-09-22 DIAGNOSIS — I10 Essential (primary) hypertension: Secondary | ICD-10-CM

## 2018-09-22 NOTE — Progress Notes (Signed)
   Subjective:    Patient ID: Meghan Ortiz, female    DOB: 03-05-1941, 78 y.o.   MRN: [id number]  Hypertension  This is a chronic problem. The current episode started more than 1 year ago. Risk factors for coronary artery disease include post-menopausal state. Treatments tried: vasotec.   Blood pressure medicine and blood pressure levels reviewed today with patient. Compliant with blood pressure medicine. States does not miss a dose. No obvious side effects. Blood pressure generally good when checked elsewhere. Watching salt intake.   Was walking very reg and the gym til husb took sick.  Husband just passed away on New Year's Eve.  Patient understandably still stressed and actively grieving depression     Review of Systems No headache, no major weight loss or weight gain, no chest pain no back pain abdominal pain no change in bowel habits complete ROS otherwise negative     Objective:   Physical Exam Alert vitals stable, NAD. Blood pressure good on repeat. HEENT normal. Lungs clear. Heart regular rate and rhythm.        Assessment & Plan:  Impression hypertension.  Discussed maintain same meds  2.  Grief.  Patient already on Effexor through her GYN.  Compliant with this  3.  Hypothyroidism followed in recent years by her GYN  Blood pressure meds refilled follow-up in 6 months exercise encouraged.

## 2018-10-27 ENCOUNTER — Other Ambulatory Visit: Payer: Self-pay | Admitting: Family Medicine

## 2019-01-23 ENCOUNTER — Other Ambulatory Visit: Payer: Self-pay | Admitting: Family Medicine

## 2019-02-04 ENCOUNTER — Other Ambulatory Visit: Payer: Self-pay

## 2019-02-04 ENCOUNTER — Encounter (INDEPENDENT_AMBULATORY_CARE_PROVIDER_SITE_OTHER): Payer: Medicare Other | Admitting: Ophthalmology

## 2019-02-04 DIAGNOSIS — H43813 Vitreous degeneration, bilateral: Secondary | ICD-10-CM

## 2019-02-04 DIAGNOSIS — D3131 Benign neoplasm of right choroid: Secondary | ICD-10-CM

## 2019-02-05 ENCOUNTER — Other Ambulatory Visit (HOSPITAL_COMMUNITY): Payer: Self-pay | Admitting: Obstetrics and Gynecology

## 2019-02-05 DIAGNOSIS — Z1231 Encounter for screening mammogram for malignant neoplasm of breast: Secondary | ICD-10-CM

## 2019-02-12 ENCOUNTER — Other Ambulatory Visit: Payer: Self-pay

## 2019-02-12 ENCOUNTER — Ambulatory Visit (HOSPITAL_COMMUNITY)
Admission: RE | Admit: 2019-02-12 | Discharge: 2019-02-12 | Disposition: A | Discharge status: Home / Self Care | Payer: Medicare Other | Source: Ambulatory Visit | Attending: Obstetrics and Gynecology | Admitting: Obstetrics and Gynecology

## 2019-02-12 DIAGNOSIS — Z1231 Encounter for screening mammogram for malignant neoplasm of breast: Secondary | ICD-10-CM | POA: Insufficient documentation

## 2019-03-23 ENCOUNTER — Ambulatory Visit: Payer: Medicare Other | Admitting: Family Medicine

## 2019-03-26 DIAGNOSIS — Z01419 Encounter for gynecological examination (general) (routine) without abnormal findings: Secondary | ICD-10-CM | POA: Diagnosis not present

## 2019-03-26 DIAGNOSIS — Z1211 Encounter for screening for malignant neoplasm of colon: Secondary | ICD-10-CM | POA: Diagnosis not present

## 2019-03-26 DIAGNOSIS — Z6829 Body mass index (BMI) 29.0-29.9, adult: Secondary | ICD-10-CM | POA: Diagnosis not present

## 2019-03-26 DIAGNOSIS — Z760 Encounter for issue of repeat prescription: Secondary | ICD-10-CM | POA: Diagnosis not present

## 2019-03-26 DIAGNOSIS — F329 Major depressive disorder, single episode, unspecified: Secondary | ICD-10-CM | POA: Diagnosis not present

## 2019-03-26 DIAGNOSIS — E039 Hypothyroidism, unspecified: Secondary | ICD-10-CM | POA: Diagnosis not present

## 2019-03-30 ENCOUNTER — Ambulatory Visit (INDEPENDENT_AMBULATORY_CARE_PROVIDER_SITE_OTHER): Payer: Medicare Other | Admitting: Family Medicine

## 2019-03-30 ENCOUNTER — Encounter: Payer: Self-pay | Admitting: Family Medicine

## 2019-03-30 ENCOUNTER — Other Ambulatory Visit: Payer: Self-pay

## 2019-03-30 DIAGNOSIS — E039 Hypothyroidism, unspecified: Secondary | ICD-10-CM | POA: Diagnosis not present

## 2019-03-30 DIAGNOSIS — Z79899 Other long term (current) drug therapy: Secondary | ICD-10-CM

## 2019-03-30 DIAGNOSIS — I10 Essential (primary) hypertension: Secondary | ICD-10-CM

## 2019-03-30 DIAGNOSIS — E785 Hyperlipidemia, unspecified: Secondary | ICD-10-CM

## 2019-03-30 MED ORDER — ENALAPRIL MALEATE 5 MG PO TABS: 5.0000 mg | ORAL_TABLET | Freq: Every day | ORAL | 1 refills | Status: DC

## 2019-03-30 NOTE — Progress Notes (Signed)
   Subjective:  Audio  Patient ID: Meghan Ortiz, female    DOB: 12-15-1940, 78 y.o.   MRN: [id number]  Hypertension This is a chronic problem. There are no compliance problems.   Pt does check BP at times; some times in the morning and sometimes in the evening.  Pt had GYN appt on Thursday and BP was  137/74. Pt states that gyn placed her on Levothyroxine last year; she alternates between 50 mcg and 75 mcg.  Virtual Visit via Video Note  I connected with Meghan Ortiz on 03/30/19 at  9:00 AM EDT by a video enabled telemedicine application and verified that I am speaking with the correct person using two identifiers.  Location: Patient: home Provider: office   I discussed the limitations of evaluation and management by telemedicine and the availability of in person appointments. The patient expressed understanding and agreed to proceed.  History of Present Illness:    Observations/Objective:   Assessment and Plan:   Follow Up Instructions:    I discussed the assessment and treatment plan with the patient. The patient was provided an opportunity to ask questions and all were answered. The patient agreed with the plan and demonstrated an understanding of the instructions.   The patient was advised to call back or seek an in-person evaluation if the symptoms worsen or if the condition fails to improve as anticipated.  I provided minutes of non-face-to-face time during this encounter.   Vicente Males, LPN  Blood pressure medicine and blood pressure levels reviewed today with patient. Compliant with blood pressure medicine. States does not miss a dose. No obvious side effects. Blood pressure generally good when checked elsewhere. Watching salt intake.  137 over 74  exercise  Patient on thyroid medication.  Claims compliance.  This is been provided by her GYN.  She would like [place] to start testing for that.  No symptoms of high or low thyroid  Review of Systems No  headache, no major weight loss or weight gain, no chest pain no back pain abdominal pain no change in bowel habits complete ROS otherwise negative     Objective:   Physical Exam   Virtual     Assessment & Plan:  Impression 1 hypertension.  Apparent good control discussed maintain same meds  2.  Hypothyroidism status uncertain discussed patient maintain current dose.  Further recommendations based on blood work  3.  Prediabetes status uncertain will be evaluating sugar as part of blood work  4.  Chronic reflux  Medications refilled diet exercise discussed follow-up in 6 months warning signs discussed coronavirus education given

## 2019-06-10 NOTE — Congregational Nurse Program (Signed)
  Dept: [phone]   Congregational Nurse Program Note  Date of Encounter: 06/10/2019  Past Medical History: No past medical history on file.  Encountr Detail     B/P 133/80  Pulse 80  Wanted B/Pchecked  And  A Flu Shot.   Theron Arista RN [phone]

## 2019-09-08 DIAGNOSIS — Z23 Encounter for immunization: Secondary | ICD-10-CM | POA: Diagnosis not present

## 2019-10-01 ENCOUNTER — Encounter: Payer: Self-pay | Admitting: Family Medicine

## 2019-10-09 ENCOUNTER — Other Ambulatory Visit: Payer: Self-pay | Admitting: Family Medicine

## 2019-10-09 DIAGNOSIS — Z23 Encounter for immunization: Secondary | ICD-10-CM | POA: Diagnosis not present

## 2019-10-09 NOTE — Telephone Encounter (Signed)
Tried cell vm box full. lvm on home phone

## 2019-10-09 NOTE — Telephone Encounter (Signed)
Please schedule visit and then route back to send in one refill

## 2019-10-12 NOTE — Telephone Encounter (Signed)
Appt scheduled for 10/26/2019 - please send in refill of med

## 2019-10-26 ENCOUNTER — Other Ambulatory Visit: Payer: Self-pay

## 2019-10-26 ENCOUNTER — Ambulatory Visit (INDEPENDENT_AMBULATORY_CARE_PROVIDER_SITE_OTHER): Payer: Medicare Other | Admitting: Family Medicine

## 2019-10-26 DIAGNOSIS — I10 Essential (primary) hypertension: Secondary | ICD-10-CM

## 2019-10-26 DIAGNOSIS — E785 Hyperlipidemia, unspecified: Secondary | ICD-10-CM

## 2019-10-26 DIAGNOSIS — E039 Hypothyroidism, unspecified: Secondary | ICD-10-CM

## 2019-10-26 MED ORDER — ENALAPRIL MALEATE 5 MG PO TABS: ORAL_TABLET | ORAL | 1 refills | Status: DC

## 2019-10-26 NOTE — Progress Notes (Signed)
   Subjective:  Audio only  Patient ID: Meghan Ortiz, female    DOB: [birth year], 79 y.o.   MRN: [id number]  Hypertension This is a chronic problem. There are no compliance problems.   pt states her blood pressure is doing well, no issues or concerns. Checks blood pressure regularly and takes meds as prescribed.  Virtual Visit via Telephone Note  I connected with KALIEGH ARMENT on 10/26/19 at  8:30 AM EST by telephone and verified that I am speaking with the correct person using two identifiers.  Location: Patient: home Provider: office   I discussed the limitations, risks, security and privacy concerns of performing an evaluation and management service by telephone and the availability of in person appointments. I also discussed with the patient that there may be a patient responsible charge related to this service. The patient expressed understanding and agreed to proceed.   History of Present Illness:    Observations/Objective:   Assessment and Plan:   Follow Up Instructions:    I discussed the assessment and treatment plan with the patient. The patient was provided an opportunity to ask questions and all were answered. The patient agreed with the plan and demonstrated an understanding of the instructions.   The patient was advised to call back or seek an in-person evaluation if the symptoms worsen or if the condition fails to improve as anticipated.  I provided 20 minutes of non-face-to-face time during this encounter.  Walking some  Three times per week  Has had both  Blood pressure medicine and blood pressure levels reviewed today with patient. Compliant with blood pressure medicine. States does not miss a dose. No obvious side effects. Blood pressure generally good when checked elsewhere. Watching salt intake.  129 over 71 good numbers    Review of Systems No headache no chest pain no shortness of breath    Objective:   Physical Exam  Virtual        Assessment & Plan:  Impression hypertension.  Good control discussed to maintain same meds.  Diet exercise discussed.  Follow-up in 6 months  2.  Screening blood work did not get last year would like to do along with thyroid.  Of note thyroid supplementation provided via patient's gynecologist  Follow-up in 6 months

## 2020-02-04 ENCOUNTER — Encounter (INDEPENDENT_AMBULATORY_CARE_PROVIDER_SITE_OTHER): Payer: Medicare Other | Admitting: Ophthalmology

## 2020-02-04 ENCOUNTER — Other Ambulatory Visit: Payer: Self-pay

## 2020-02-04 DIAGNOSIS — H2513 Age-related nuclear cataract, bilateral: Secondary | ICD-10-CM

## 2020-02-04 DIAGNOSIS — D3131 Benign neoplasm of right choroid: Secondary | ICD-10-CM

## 2020-02-04 DIAGNOSIS — H43813 Vitreous degeneration, bilateral: Secondary | ICD-10-CM

## 2020-03-07 ENCOUNTER — Other Ambulatory Visit (HOSPITAL_COMMUNITY): Payer: Self-pay | Admitting: Obstetrics and Gynecology

## 2020-03-07 DIAGNOSIS — Z1231 Encounter for screening mammogram for malignant neoplasm of breast: Secondary | ICD-10-CM

## 2020-03-11 DIAGNOSIS — D3131 Benign neoplasm of right choroid: Secondary | ICD-10-CM | POA: Diagnosis not present

## 2020-03-17 ENCOUNTER — Other Ambulatory Visit: Payer: Self-pay

## 2020-03-17 ENCOUNTER — Ambulatory Visit (HOSPITAL_COMMUNITY)
Admission: RE | Admit: 2020-03-17 | Discharge: 2020-03-17 | Disposition: A | Discharge status: Home / Self Care | Payer: Medicare Other | Source: Ambulatory Visit | Attending: Obstetrics and Gynecology | Admitting: Obstetrics and Gynecology

## 2020-03-17 DIAGNOSIS — Z1231 Encounter for screening mammogram for malignant neoplasm of breast: Secondary | ICD-10-CM | POA: Insufficient documentation

## 2020-03-22 DIAGNOSIS — E785 Hyperlipidemia, unspecified: Secondary | ICD-10-CM | POA: Diagnosis not present

## 2020-03-22 DIAGNOSIS — I10 Essential (primary) hypertension: Secondary | ICD-10-CM | POA: Diagnosis not present

## 2020-03-22 DIAGNOSIS — E039 Hypothyroidism, unspecified: Secondary | ICD-10-CM | POA: Diagnosis not present

## 2020-03-23 LAB — BASIC METABOLIC PANEL
BUN/Creatinine Ratio: 20 (ref 12–28)
BUN: 16 mg/dL (ref 8–27)
CO2: 25 mmol/L (ref 20–29)
Calcium: 9.5 mg/dL (ref 8.7–10.3)
Chloride: 101 mmol/L (ref 96–106)
Creatinine, Ser: 0.79 mg/dL (ref 0.57–1.00)
GFR calc Af Amer: 84 mL/min/{1.73_m2} (ref >59)
GFR calc non Af Amer: 73 mL/min/{1.73_m2} (ref >59)
Glucose: 131 mg/dL — ABNORMAL HIGH (ref 65–99)
Potassium: 4.7 mmol/L (ref 3.5–5.2)
Sodium: 137 mmol/L (ref 134–144)

## 2020-03-23 LAB — LIPID PANEL
Chol/HDL Ratio: 4.1 ratio (ref 0.0–4.4)
Cholesterol, Total: 239 mg/dL — ABNORMAL HIGH (ref 100–199)
HDL: 59 mg/dL (ref >39)
LDL Chol Calc (NIH): 149 mg/dL — ABNORMAL HIGH (ref 0–99)
Triglycerides: 175 mg/dL — ABNORMAL HIGH (ref 0–149)
VLDL Cholesterol Cal: 31 mg/dL (ref 5–40)

## 2020-03-23 LAB — HEPATIC FUNCTION PANEL
ALT: 11 IU/L (ref 0–32)
AST: 12 IU/L (ref 0–40)
Albumin: 4.1 g/dL (ref 3.7–4.7)
Alkaline Phosphatase: 92 IU/L (ref 48–121)
Bilirubin Total: 0.4 mg/dL (ref 0.0–1.2)
Bilirubin, Direct: 0.1 mg/dL (ref 0.00–0.40)
Total Protein: 6.6 g/dL (ref 6.0–8.5)

## 2020-03-23 LAB — TSH: TSH: 2.67 u[IU]/mL (ref 0.450–4.500)

## 2020-04-15 ENCOUNTER — Ambulatory Visit (INDEPENDENT_AMBULATORY_CARE_PROVIDER_SITE_OTHER): Payer: Medicare Other | Admitting: Nurse Practitioner

## 2020-04-15 ENCOUNTER — Encounter: Payer: Self-pay | Admitting: Nurse Practitioner

## 2020-04-15 ENCOUNTER — Other Ambulatory Visit: Payer: Self-pay

## 2020-04-15 VITALS — BP 130/70 | Temp 97.1°F | Wt 161.0 lb

## 2020-04-15 DIAGNOSIS — E785 Hyperlipidemia, unspecified: Secondary | ICD-10-CM | POA: Diagnosis not present

## 2020-04-15 DIAGNOSIS — R7301 Impaired fasting glucose: Secondary | ICD-10-CM | POA: Diagnosis not present

## 2020-04-15 DIAGNOSIS — Z634 Disappearance and death of family member: Secondary | ICD-10-CM | POA: Diagnosis not present

## 2020-04-15 DIAGNOSIS — Z79899 Other long term (current) drug therapy: Secondary | ICD-10-CM

## 2020-04-15 LAB — POCT GLYCOSYLATED HEMOGLOBIN (HGB A1C): Hemoglobin A1C: 5.2 % (ref 4.0–5.6)

## 2020-04-15 MED ORDER — ROSUVASTATIN CALCIUM 5 MG PO TABS
ORAL_TABLET | ORAL | 2 refills | Status: DC
Start: 2020-04-15 — End: 2020-06-13

## 2020-04-15 NOTE — Patient Instructions (Signed)
Fat and Cholesterol Restricted Eating Plan Getting too much fat and cholesterol in your diet may cause health problems. Choosing the right foods helps keep your fat and cholesterol at normal levels. This can keep you from getting certain diseases. Your doctor may recommend an eating plan that includes:  Total fat: ______% or less of total calories a day.  Saturated fat: ______% or less of total calories a day.  Cholesterol: less than _________mg a day.  Fiber: ______g a day. What are tips for following this plan? Meal planning  At meals, divide your plate into four equal parts: ? Fill one-half of your plate with vegetables and green salads. ? Fill one-fourth of your plate with whole grains. ? Fill one-fourth of your plate with low-fat (lean) protein foods.  Eat fish that is high in omega-3 fats at least two times a week. This includes mackerel, tuna, sardines, and salmon.  Eat foods that are high in fiber, such as whole grains, beans, apples, broccoli, carrots, peas, and barley. General tips   Work with your doctor to lose weight if you need to.  Avoid: ? Foods with added sugar. ? Fried foods. ? Foods with partially hydrogenated oils.  Limit alcohol intake to no more than 1 drink a day for nonpregnant women and 2 drinks a day for men. One drink equals 12 oz of beer, 5 oz of wine, or 1 oz of hard liquor. Reading food labels  Check food labels for: ? Trans fats. ? Partially hydrogenated oils. ? Saturated fat (g) in each serving. ? Cholesterol (mg) in each serving. ? Fiber (g) in each serving.  Choose foods with healthy fats, such as: ? Monounsaturated fats. ? Polyunsaturated fats. ? Omega-3 fats.  Choose grain products that have whole grains. Look for the word "whole" as the first word in the ingredient list. Cooking  Cook foods using low-fat methods. These include baking, boiling, grilling, and broiling.  Eat more home-cooked foods. Eat at restaurants and buffets  less often.  Avoid cooking using saturated fats, such as butter, cream, palm oil, palm kernel oil, and coconut oil. Recommended foods  Fruits  All fresh, canned (in natural juice), or frozen fruits. Vegetables  Fresh or frozen vegetables (raw, steamed, roasted, or grilled). Green salads. Grains  Whole grains, such as whole wheat or whole grain breads, crackers, cereals, and pasta. Unsweetened oatmeal, bulgur, barley, quinoa, or brown rice. Corn or whole wheat flour tortillas. Meats and other protein foods  Ground beef (85% or leaner), grass-fed beef, or beef trimmed of fat. Skinless chicken or turkey. Ground chicken or turkey. Pork trimmed of fat. All fish and seafood. Egg whites. Dried beans, peas, or lentils. Unsalted nuts or seeds. Unsalted canned beans. Nut butters without added sugar or oil. Dairy  Low-fat or nonfat dairy products, such as skim or 1% milk, 2% or reduced-fat cheeses, low-fat and fat-free ricotta or cottage cheese, or plain low-fat and nonfat yogurt. Fats and oils  Tub margarine without trans fats. Light or reduced-fat mayonnaise and salad dressings. Avocado. Olive, canola, sesame, or safflower oils. The items listed above may not be a complete list of foods and beverages you can eat. Contact a dietitian for more information. Foods to avoid Fruits  Canned fruit in heavy syrup. Fruit in cream or butter sauce. Fried fruit. Vegetables  Vegetables cooked in cheese, cream, or butter sauce. Fried vegetables. Grains  White bread. White pasta. White rice. Cornbread. Bagels, pastries, and croissants. Crackers and snack foods that contain trans fat   and hydrogenated oils. Meats and other protein foods  Fatty cuts of meat. Ribs, chicken wings, bacon, sausage, bologna, salami, chitterlings, fatback, hot dogs, bratwurst, and packaged lunch meats. Liver and organ meats. Whole eggs and egg yolks. Chicken and turkey with skin. Fried meat. Dairy  Whole or 2% milk, cream,  half-and-half, and cream cheese. Whole milk cheeses. Whole-fat or sweetened yogurt. Full-fat cheeses. Nondairy creamers and whipped toppings. Processed cheese, cheese spreads, and cheese curds. Beverages  Alcohol. Sugar-sweetened drinks such as sodas, lemonade, and fruit drinks. Fats and oils  Butter, stick margarine, lard, shortening, ghee, or bacon fat. Coconut, palm kernel, and palm oils. Sweets and desserts  Corn syrup, sugars, honey, and molasses. Candy. Jam and jelly. Syrup. Sweetened cereals. Cookies, pies, cakes, donuts, muffins, and ice cream. The items listed above may not be a complete list of foods and beverages you should avoid. Contact a dietitian for more information. Summary  Choosing the right foods helps keep your fat and cholesterol at normal levels. This can keep you from getting certain diseases.  At meals, fill one-half of your plate with vegetables and green salads.  Eat high-fiber foods, like whole grains, beans, apples, carrots, peas, and barley.  Limit added sugar, saturated fats, alcohol, and fried foods. This information is not intended to replace advice given to you by your health care provider. Make sure you discuss any questions you have with your health care provider. Document Revised: 04/16/2018 Document Reviewed: 04/30/2017 Elsevier Patient Education  2020 [employer]  

## 2020-04-15 NOTE — Progress Notes (Signed)
Subjective:    Patient ID: Meghan Ortiz, female    DOB: 1941/01/04, 79 y.o.   MRN: [id number]  HPI Pt here to discuss labs. Pt was informed to come in office to discuss starting statin.  Adherent to medication regimen. Trying to do better with her diet. Has cut back on sugars such as ice cream. Has found it more difficult to cook for one person since her husband passed away about 18 months ago. COVID hit right after this. Married for 59 years. Since the gym is not an option, regular walking program 3 x per week.  Has "good days and bad days" but has excellent support system in her family, friends and church. Does not feel she needs counseling at this time. Dr. Radene Knee, her GYN prescribes her thyroid medicine and Effexor. She gets her physicals and other preventive health at his office.  GERD symptoms controlled with Prevacid, prescribed by another provider. Denies CP/ischemic type pain or SOB. No edema.   Review of Systems     Objective:   Physical Exam NAD.  Alert, oriented.  Lungs clear.  Heart regular rate rhythm.  Carotids no bruits or thrills.  Abdomen soft nondistended, no epigastric area tenderness noted.  Recent Results (from the past 2160 hour(s))  TSH     Status: None   Collection Time: 03/22/20  8:05 AM  Result Value Ref Range   TSH 2.670 0.450 - 4.500 uIU/mL  Lipid Profile     Status: Abnormal   Collection Time: 03/22/20  8:05 AM  Result Value Ref Range   Cholesterol, Total 239 (H) 100 - 199 mg/dL   Triglycerides 175 (H) 0 - 149 mg/dL   HDL 59 >39 mg/dL   VLDL Cholesterol Cal 31 5 - 40 mg/dL   LDL Chol Calc (NIH) 149 (H) 0 - 99 mg/dL   Chol/HDL Ratio 4.1 0.0 - 4.4 ratio    Comment:                                   T. Chol/HDL Ratio                                             Men  Women                               1/2 Avg.Risk  3.4    3.3                                   Avg.Risk  5.0    4.4                                2X Avg.Risk  9.6    7.1                                 3X Avg.Risk 23.4   11.0   Hepatic function panel     Status: None   Collection Time: 03/22/20  8:05 AM  Result Value Ref Range   Total Protein 6.6 6.0 -  8.5 g/dL   Albumin 4.1 3.7 - 4.7 g/dL   Bilirubin Total 0.4 0.0 - 1.2 mg/dL   Bilirubin, Direct 0.10 0.00 - 0.40 mg/dL   Alkaline Phosphatase 92 48 - 121 IU/L   AST 12 0 - 40 IU/L   ALT 11 0 - 32 IU/L  Basic Metabolic Panel (BMET)     Status: Abnormal   Collection Time: 03/22/20  8:05 AM  Result Value Ref Range   Glucose 131 (H) 65 - 99 mg/dL   BUN 16 8 - 27 mg/dL   Creatinine, Ser 0.79 0.57 - 1.00 mg/dL   GFR calc non Af Amer 73 >59 mL/min/1.73   GFR calc Af Amer 84 >59 mL/min/1.73    Comment: **[facility] currently reports eGFR in compliance with the current**   recommendations of the [employer]. [facility] will   update reporting as new guidelines are published from the NKF-ASN   Task force.    BUN/Creatinine Ratio 20 12 - 28   Sodium 137 134 - 144 mmol/L   Potassium 4.7 3.5 - 5.2 mmol/L   Chloride 101 96 - 106 mmol/L   CO2 25 20 - 29 mmol/L   Calcium 9.5 8.7 - 10.3 mg/dL  POCT HgB A1C     Status: None   Collection Time: 04/15/20 10:57 AM  Result Value Ref Range   Hemoglobin A1C 5.2 4.0 - 5.6 %   HbA1c POC (<> result, manual entry)     HbA1c, POC (prediabetic range)     HbA1c, POC (controlled diabetic range)     Reviewed labs with patient, thinks her sugar may have been off due to ice cream that she ate late the night before.  Concerns discussed. Today's Vitals   04/15/20 1019  BP: 130/70  Temp: (!) 97.1 F (36.2 C)  Weight: 161 lb (73 kg)   Body mass index is 28.07 kg/m. Slow steady weight loss which was purposeful.     Assessment & Plan:   Problem List Items Addressed This Visit      Endocrine   Impaired fasting blood sugar - Primary   Relevant Orders   POCT HgB A1C (Completed)     Other   Bereavement   Hyperlipidemia LDL goal <100   Relevant Medications   rosuvastatin  (CRESTOR) 5 MG tablet   Other Relevant Orders   Lipid Profile    Other Visit Diagnoses    High risk medication use       Relevant Orders   Lipid Profile   Hepatic function panel     Meds ordered this encounter  Medications  . rosuvastatin (CRESTOR) 5 MG tablet    Sig: Take one pill by mouth Mondays, Wednesdays, Fridays    Dispense:  12 tablet    Refill:  2    Order Specific Question:   Supervising Provider    Answer:   Sallee Lange A [[id number]]   Trial of low-dose rosuvastatin 3 times a week with repeat lipid and liver profiles in 2 months.  Our goal is to get her LDL close to 100. Encourage continued activity and healthy diet.  Continue daily calcium and vitamin D. Return in about 6 months (around 10/16/2020).

## 2020-04-16 ENCOUNTER — Encounter: Payer: Self-pay | Admitting: Nurse Practitioner

## 2020-04-16 DIAGNOSIS — Z634 Disappearance and death of family member: Secondary | ICD-10-CM | POA: Insufficient documentation

## 2020-05-05 ENCOUNTER — Ambulatory Visit: Payer: Self-pay

## 2020-05-12 ENCOUNTER — Ambulatory Visit: Payer: Self-pay

## 2020-05-12 ENCOUNTER — Ambulatory Visit: Payer: Medicare Other | Attending: Internal Medicine

## 2020-05-12 DIAGNOSIS — Z23 Encounter for immunization: Secondary | ICD-10-CM

## 2020-05-12 NOTE — Progress Notes (Signed)
   Covid-19 Vaccination Clinic  Name:  Meghan Ortiz    MRN: [id number] DOB: June 07, 1941  05/12/2020  Meghan Ortiz was observed post Covid-19 immunization for 15 minutes without incident. She was provided with Vaccine Information Sheet and instruction to access the V-Safe system.   Meghan Ortiz was instructed to call 911 with any severe reactions post vaccine: Marland Kitchen Difficulty breathing  . Swelling of face and throat  . A fast heartbeat  . A bad rash all over body  . Dizziness and weakness

## 2020-06-01 DIAGNOSIS — Z6829 Body mass index (BMI) 29.0-29.9, adult: Secondary | ICD-10-CM | POA: Diagnosis not present

## 2020-06-01 DIAGNOSIS — I10 Essential (primary) hypertension: Secondary | ICD-10-CM | POA: Insufficient documentation

## 2020-06-01 DIAGNOSIS — N958 Other specified menopausal and perimenopausal disorders: Secondary | ICD-10-CM | POA: Diagnosis not present

## 2020-06-01 DIAGNOSIS — Z01419 Encounter for gynecological examination (general) (routine) without abnormal findings: Secondary | ICD-10-CM | POA: Diagnosis not present

## 2020-06-01 DIAGNOSIS — E079 Disorder of thyroid, unspecified: Secondary | ICD-10-CM | POA: Insufficient documentation

## 2020-06-01 DIAGNOSIS — M8588 Other specified disorders of bone density and structure, other site: Secondary | ICD-10-CM | POA: Diagnosis not present

## 2020-06-10 DIAGNOSIS — Z79899 Other long term (current) drug therapy: Secondary | ICD-10-CM | POA: Diagnosis not present

## 2020-06-11 LAB — LIPID PANEL
Chol/HDL Ratio: 3.3 ratio (ref 0.0–4.4)
Cholesterol, Total: 197 mg/dL (ref 100–199)
HDL: 59 mg/dL (ref >39)
LDL Chol Calc (NIH): 118 mg/dL — ABNORMAL HIGH (ref 0–99)
Triglycerides: 113 mg/dL (ref 0–149)
VLDL Cholesterol Cal: 20 mg/dL (ref 5–40)

## 2020-06-11 LAB — HEPATIC FUNCTION PANEL
ALT: 9 IU/L (ref 0–32)
AST: 12 IU/L (ref 0–40)
Albumin: 4.3 g/dL (ref 3.7–4.7)
Alkaline Phosphatase: 82 IU/L (ref 44–121)
Bilirubin Total: 0.4 mg/dL (ref 0.0–1.2)
Bilirubin, Direct: 0.1 mg/dL (ref 0.00–0.40)
Total Protein: 6.6 g/dL (ref 6.0–8.5)

## 2020-06-13 ENCOUNTER — Other Ambulatory Visit: Payer: Self-pay | Admitting: *Deleted

## 2020-06-13 MED ORDER — ROSUVASTATIN CALCIUM 5 MG PO TABS: ORAL_TABLET | ORAL | 5 refills | Status: DC

## 2020-07-05 ENCOUNTER — Other Ambulatory Visit: Payer: Self-pay | Admitting: Family Medicine

## 2020-07-15 DIAGNOSIS — D3131 Benign neoplasm of right choroid: Secondary | ICD-10-CM | POA: Diagnosis not present

## 2020-10-14 IMAGING — MG DIGITAL SCREENING BILAT W/ TOMO W/ CAD
7 series · 8 of 27 positions shown · non-contrast
Comparison: Previous exam(s).

CLINICAL DATA: Screening.

EXAM:
DIGITAL SCREENING BILATERAL MAMMOGRAM WITH TOMO AND CAD

[L CC synth-2D]
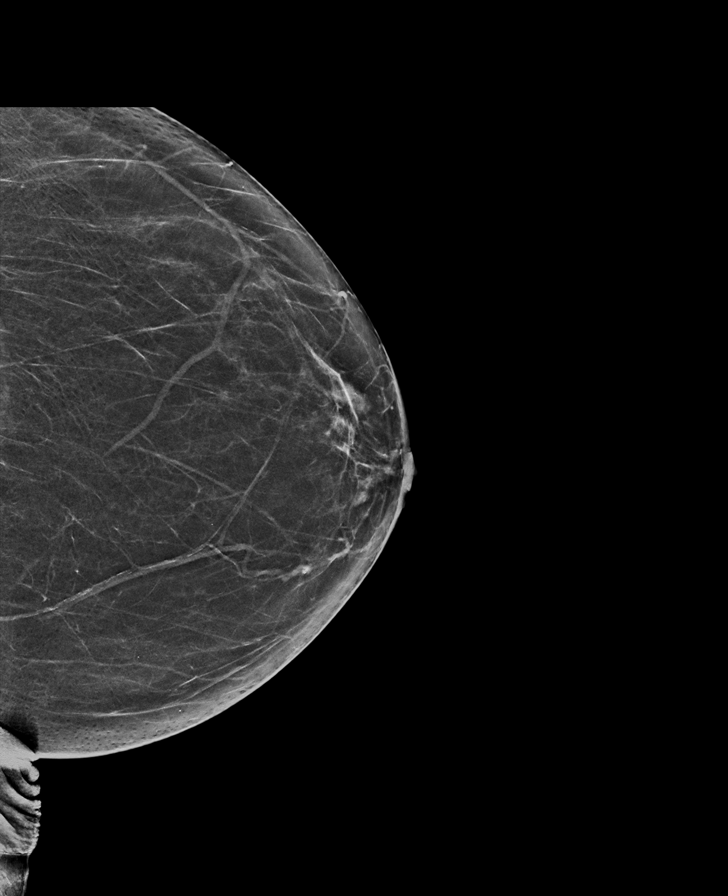

[R CC synth-2D]
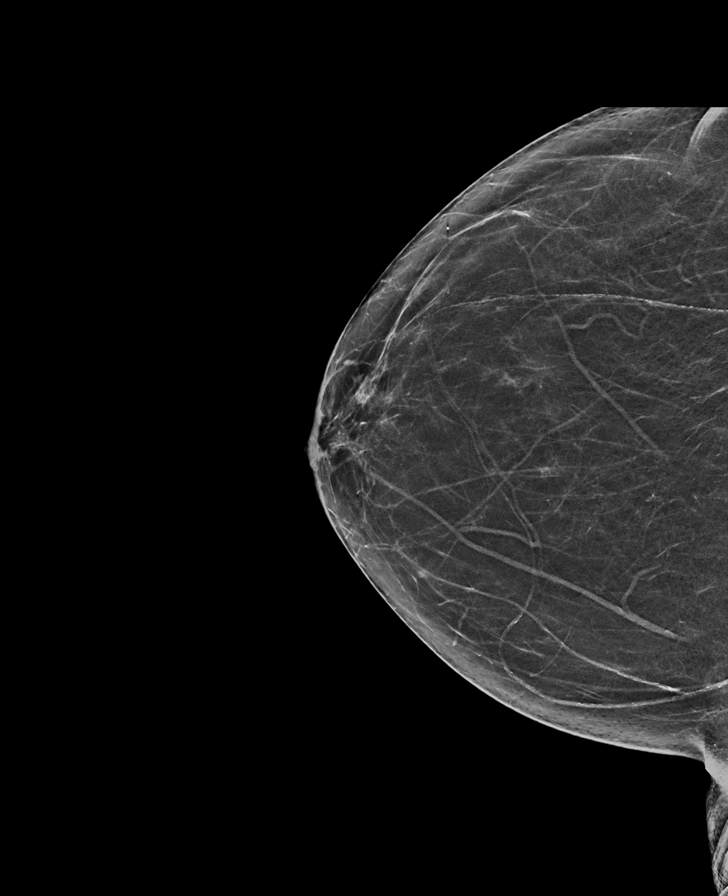

[R CC tomo · 2 of 59 frames shown]
[frame 20/59]
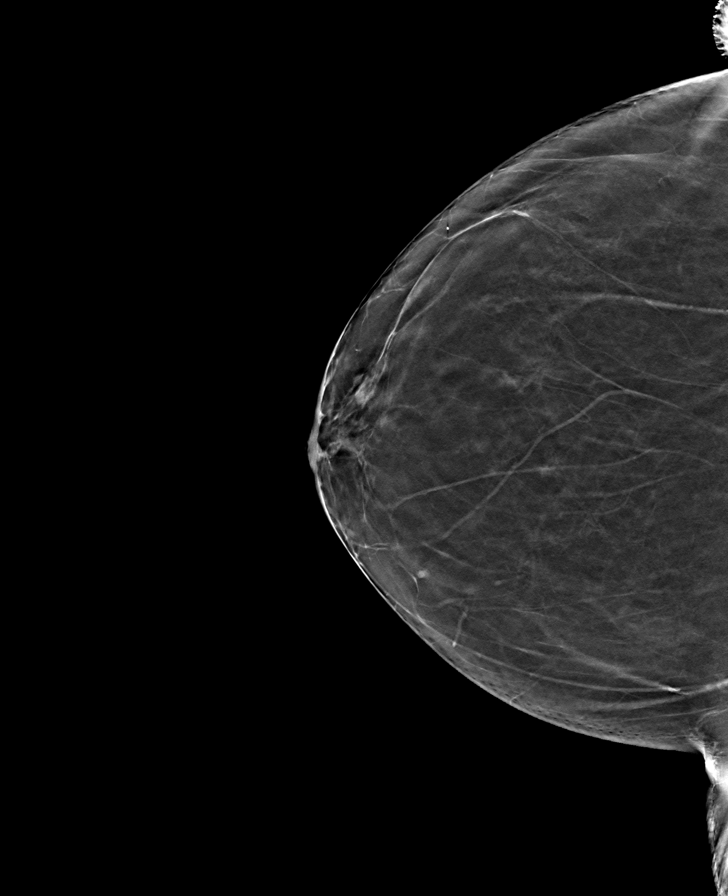
[frame 30/59]
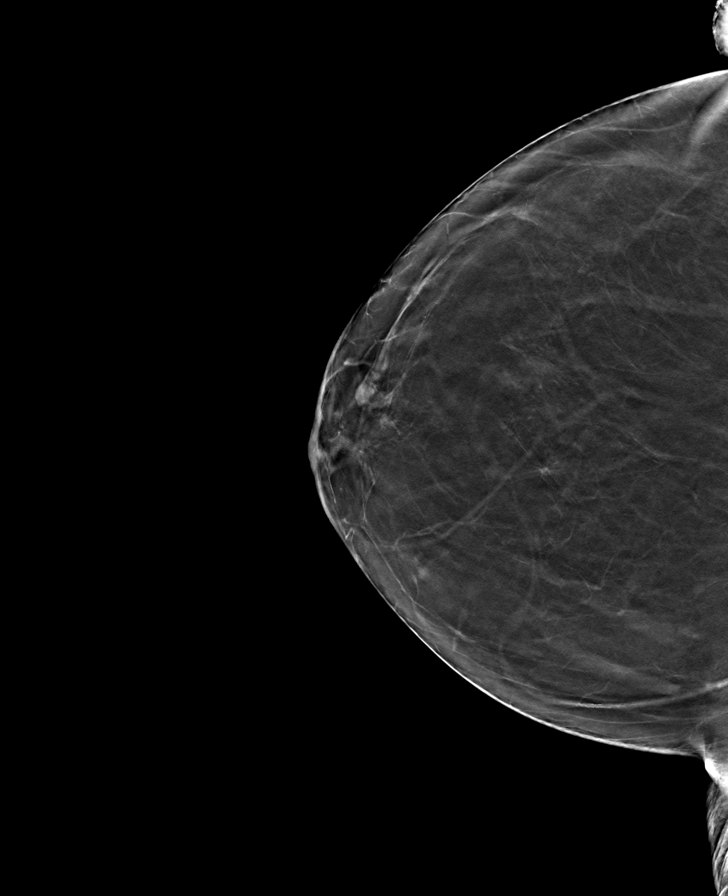

[L MLO tomo (1 of 2) · tomo slice 33/65.0]
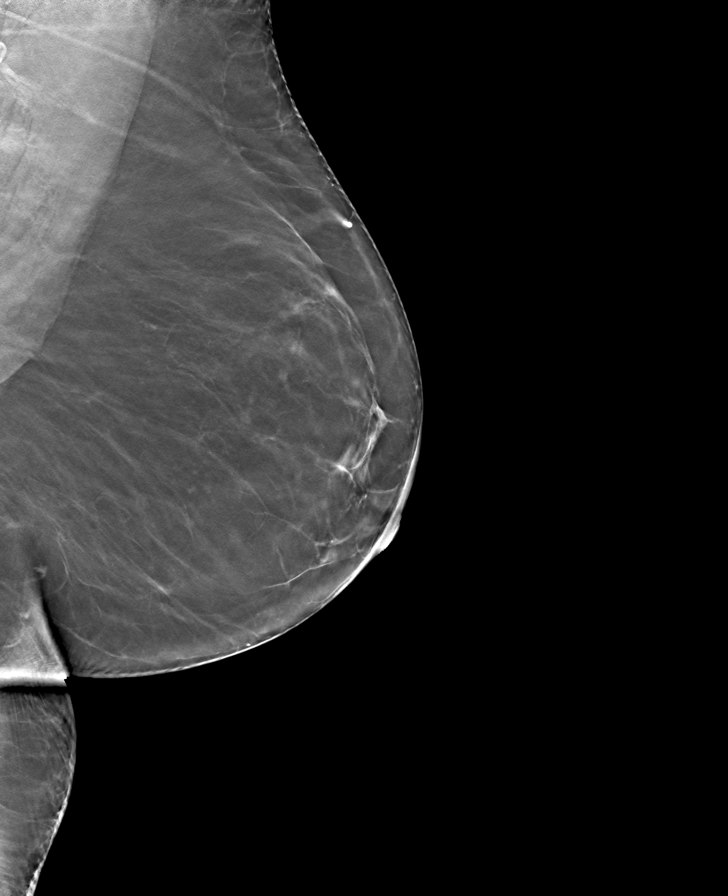

[L CC tomo · tomo slice 31/60.0]
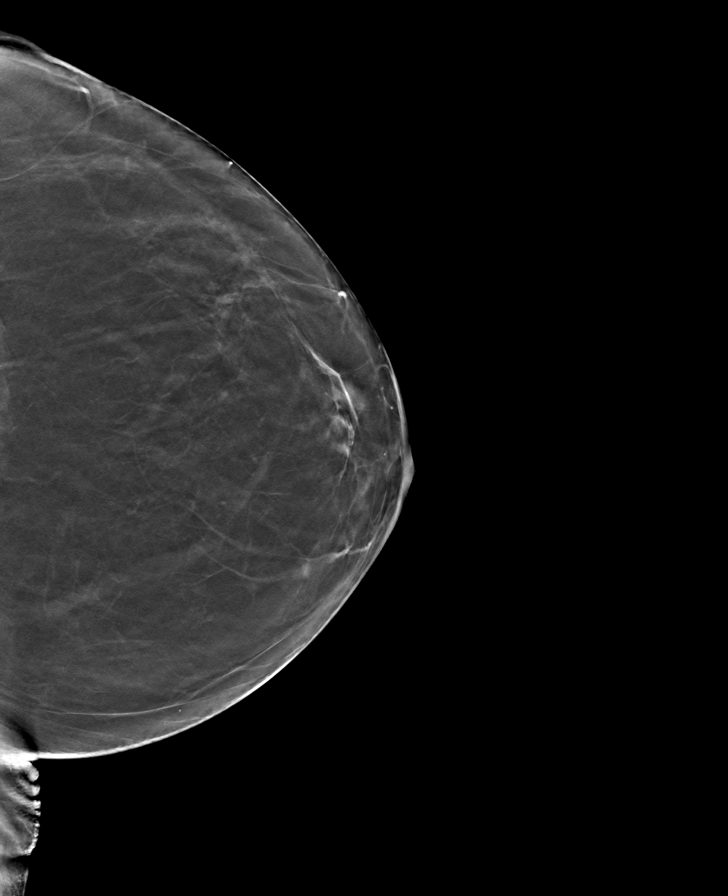

[L MLO tomo (2 of 2) · tomo slice 34/67.0]
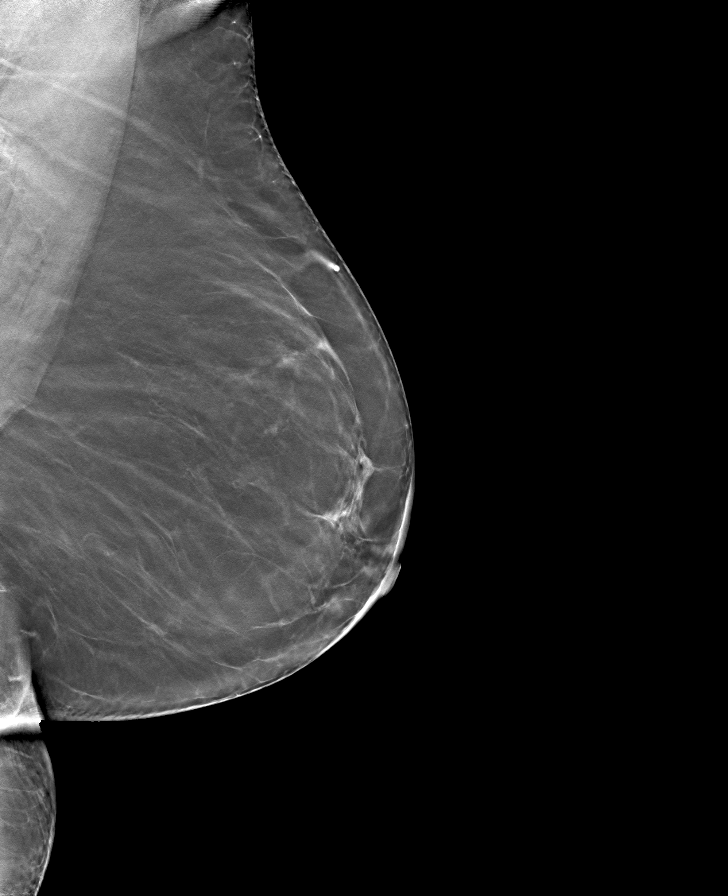

[R MLO tomo · tomo slice 31/62.0]
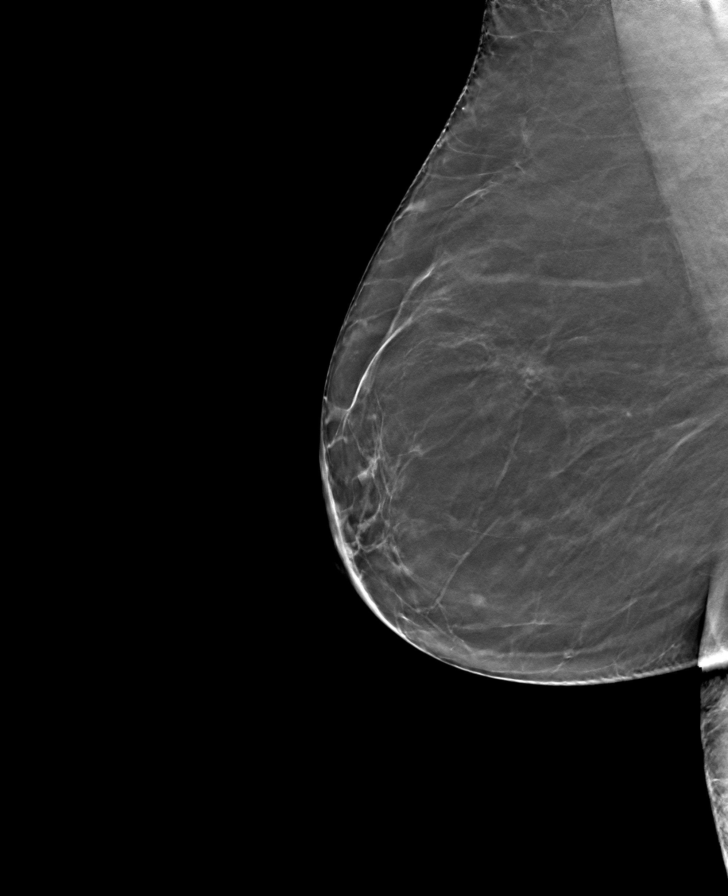

[8 of 27 positions shown; findings below may reference images not displayed]

ACR Breast Density Category b: There are scattered areas of
fibroglandular density.
FINDINGS: There are no findings suspicious for malignancy. Images were
processed with CAD.
IMPRESSION: No mammographic evidence of malignancy. A result letter of this
screening mammogram will be mailed directly to the patient.

RECOMMENDATION:
Screening mammogram in one year. (Code:[id number]-775)

BI-RADS CATEGORY  1: Negative.

## 2020-10-21 ENCOUNTER — Other Ambulatory Visit: Payer: Self-pay

## 2020-10-21 ENCOUNTER — Ambulatory Visit (INDEPENDENT_AMBULATORY_CARE_PROVIDER_SITE_OTHER): Payer: Medicare Other | Admitting: Nurse Practitioner

## 2020-10-21 ENCOUNTER — Encounter: Payer: Self-pay | Admitting: Nurse Practitioner

## 2020-10-21 VITALS — BP 135/72 | HR 69 | Temp 97.7°F | Ht 63.5 in | Wt 159.0 lb

## 2020-10-21 DIAGNOSIS — I10 Essential (primary) hypertension: Secondary | ICD-10-CM

## 2020-10-21 DIAGNOSIS — E785 Hyperlipidemia, unspecified: Secondary | ICD-10-CM | POA: Diagnosis not present

## 2020-10-21 DIAGNOSIS — J300 Vasomotor rhinitis: Secondary | ICD-10-CM

## 2020-10-21 MED ORDER — ROSUVASTATIN CALCIUM 5 MG PO TABS: ORAL_TABLET | ORAL | 1 refills | Status: DC

## 2020-10-21 MED ORDER — ENALAPRIL MALEATE 5 MG PO TABS: ORAL_TABLET | ORAL | 1 refills | Status: DC

## 2020-10-21 NOTE — Patient Instructions (Signed)
Use flonase (OTC) nasal spray daily to help with sinus drainage

## 2020-10-21 NOTE — Progress Notes (Signed)
Subjective:    Patient ID: Meghan Ortiz, female    DOB: Nov 30, 1940, 80 y.o.   MRN: [id number]  HPI  Patient presents for a wellness exam.  She reports generally doing well.  She has lost a few lbs since her last visit.  She has cut out most of the bread in her diet, and tries to eat mostly lean proteins and vegetables.  She also walks 3 times a week.  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 Denies issues with vision currently.   Denies ever being a smoker, any use of alcohol, or illegal drug use.  Her husband passed away 2 years ago and she denies being in a relationship.    She complains of 'hearing my heart beat in my ears' when she lays down to sleep at night.  No issues with hearing loss or hearing the 'whooshing' sound at any other time of the day. She does c/o some continuous mild sinus drainage she has had for a few weeks.  Denies cough, sneezing, fever, or sick contacts.   Review of Systems  Constitutional: Negative for fatigue and unexpected weight change.  HENT: Positive for postnasal drip, rhinorrhea and sinus pain. Negative for hearing loss.        Clear nasal drainage.   Eyes: Negative for discharge and visual disturbance.  Respiratory: Negative for cough, chest tightness, shortness of breath and wheezing.   Cardiovascular: Negative for chest pain, palpitations and leg swelling.  Gastrointestinal: Negative for abdominal pain, blood in stool, constipation and diarrhea.  Neurological: Positive for headaches. Negative for dizziness, facial asymmetry, speech difficulty, weakness and numbness.       Pt reports occasional mild sinus headaches  Psychiatric/Behavioral: Negative for sleep disturbance. The patient is not nervous/anxious.        Objective:   Physical Exam Vitals and nursing note reviewed. Exam conducted with a chaperone present.  Constitutional:      General: She is not in acute distress.    Appearance: Normal  appearance.  HENT:     Ears:     Comments: TM retracted Bilat.  No erythema or irritation Bilat.    Nose: Rhinorrhea present.     Comments: Clear, watery nasal drainage visualized in nares BIL.  Pale, boggy mucus membranes visualized inside nares BIL    Mouth/Throat:     Mouth: Mucous membranes are moist.     Pharynx: Oropharynx is clear. No oropharyngeal exudate or posterior oropharyngeal erythema.  Eyes:     General:        Right eye: No discharge.        Left eye: No discharge.  Neck:     Comments: Mild soft anterior cervical adenopathy.  Cardiovascular:     Rate and Rhythm: Normal rate and regular rhythm.     Heart sounds: Normal heart sounds. No murmur heard.     Comments: Carotids no bruits or thrills.  Pulmonary:     Effort: No respiratory distress.     Breath sounds: Normal breath sounds.  Abdominal:     General: Bowel sounds are normal.     Palpations: Abdomen is soft. There is no mass.     Tenderness: There is no abdominal tenderness. There is no guarding.  Musculoskeletal:     Right lower leg: No edema.     Left lower leg: No edema.  Lymphadenopathy:     Cervical: Cervical adenopathy present.  Skin:  General: Skin is warm and dry.  Neurological:     Mental Status: She is oriented to person, place, and time.  Psychiatric:        Mood and Affect: Mood normal.        Behavior: Behavior normal.        Thought Content: Thought content normal.        Judgment: Judgment normal.    Vitals:   10/21/20 1356  BP: 140/72  Pulse: 69  Temp: 97.7 F (36.5 C)  SpO2: 99%   Results for orders placed or performed in visit on 04/15/20  Lipid Profile  Result Value Ref Range   Cholesterol, Total 197 100 - 199 mg/dL   Triglycerides 113 0 - 149 mg/dL   HDL 59 >39 mg/dL   VLDL Cholesterol Cal 20 5 - 40 mg/dL   LDL Chol Calc (NIH) 118 (H) 0 - 99 mg/dL   Chol/HDL Ratio 3.3 0.0 - 4.4 ratio  Hepatic function panel  Result Value Ref Range   Total Protein 6.6 6.0 - 8.5  g/dL   Albumin 4.3 3.7 - 4.7 g/dL   Bilirubin Total 0.4 0.0 - 1.2 mg/dL   Bilirubin, Direct 0.10 0.00 - 0.40 mg/dL   Alkaline Phosphatase 82 44 - 121 IU/L   AST 12 0 - 40 IU/L   ALT 9 0 - 32 IU/L  POCT HgB A1C  Result Value Ref Range   Hemoglobin A1C 5.2 4.0 - 5.6 %   HbA1c POC (<> result, manual entry)     HbA1c, POC (prediabetic range)     HbA1c, POC (controlled diabetic range)        Pt reports last BP reading at home as 135/72      Assessment & Plan:   Problem List Items Addressed This Visit      Cardiovascular and Mediastinum   Essential hypertension - Primary   Relevant Medications   rosuvastatin (CRESTOR) 5 MG tablet   enalapril (VASOTEC) 5 MG tablet     Other   Hyperlipidemia LDL goal <100   Relevant Medications   rosuvastatin (CRESTOR) 5 MG tablet   enalapril (VASOTEC) 5 MG tablet    Other Visit Diagnoses    Vasomotor rhinitis          Meds ordered this encounter  Medications   rosuvastatin (CRESTOR) 5 MG tablet    Sig: Take one pill by mouth Mondays, Wednesdays, Fridays for cholesterol    Dispense:  36 tablet    Refill:  1    Order Specific Question:   Supervising Provider    Answer:   Sallee Lange A [[id number]]   enalapril (VASOTEC) 5 MG tablet    Sig: TAKE ONE TABLET (5MG  TOTAL) BY MOUTH AT BEDTIME    Dispense:  90 tablet    Refill:  1    Order Specific Question:   Supervising Provider    Answer:   Kathyrn Drown [phone]   Labs reviewed with patient.   Lipids have improved.  Continue Crestor 5 mg MWF.    Patient in agreement to try over the counter nasal steroid spray such as Nasacort or Flonase for rhinorrhea.  She will contact us in a few weeks as needed.  Follow-up in 6 months for blood pressure.  Recommend wellness exam.

## 2020-10-21 NOTE — Progress Notes (Signed)
   Subjective:    Patient ID: Meghan Ortiz, female    DOB: 1941/05/07, 80 y.o.   MRN: [id number]  Hypertension This is a chronic problem. Treatments tried: enalapril. There are no compliance problems (walks 3 times a week, eats healthy, takes meds daily).       Review of Systems     Objective:   Physical Exam        Assessment & Plan:

## 2020-10-22 ENCOUNTER — Encounter: Payer: Self-pay | Admitting: Nurse Practitioner

## 2021-02-03 ENCOUNTER — Encounter (INDEPENDENT_AMBULATORY_CARE_PROVIDER_SITE_OTHER): Payer: Medicare Other | Admitting: Ophthalmology

## 2021-02-03 ENCOUNTER — Other Ambulatory Visit: Payer: Self-pay

## 2021-02-03 DIAGNOSIS — H43813 Vitreous degeneration, bilateral: Secondary | ICD-10-CM | POA: Diagnosis not present

## 2021-02-03 DIAGNOSIS — H2513 Age-related nuclear cataract, bilateral: Secondary | ICD-10-CM | POA: Diagnosis not present

## 2021-02-03 DIAGNOSIS — D3131 Benign neoplasm of right choroid: Secondary | ICD-10-CM

## 2021-03-10 DIAGNOSIS — Z20822 Contact with and (suspected) exposure to covid-19: Secondary | ICD-10-CM | POA: Diagnosis not present

## 2021-04-12 ENCOUNTER — Other Ambulatory Visit: Payer: Self-pay | Admitting: Nurse Practitioner

## 2021-05-30 ENCOUNTER — Other Ambulatory Visit (HOSPITAL_COMMUNITY): Payer: Self-pay | Admitting: Obstetrics and Gynecology

## 2021-05-30 DIAGNOSIS — Z1231 Encounter for screening mammogram for malignant neoplasm of breast: Secondary | ICD-10-CM

## 2021-05-31 ENCOUNTER — Ambulatory Visit (HOSPITAL_COMMUNITY)
Admission: RE | Admit: 2021-05-31 | Discharge: 2021-05-31 | Disposition: A | Discharge status: Home / Self Care | Payer: Medicare Other | Source: Ambulatory Visit | Attending: Obstetrics and Gynecology | Admitting: Obstetrics and Gynecology

## 2021-05-31 ENCOUNTER — Other Ambulatory Visit: Payer: Self-pay

## 2021-05-31 DIAGNOSIS — Z1231 Encounter for screening mammogram for malignant neoplasm of breast: Secondary | ICD-10-CM

## 2021-06-05 DIAGNOSIS — Z6829 Body mass index (BMI) 29.0-29.9, adult: Secondary | ICD-10-CM | POA: Diagnosis not present

## 2021-06-05 DIAGNOSIS — Z01419 Encounter for gynecological examination (general) (routine) without abnormal findings: Secondary | ICD-10-CM | POA: Diagnosis not present

## 2021-06-12 ENCOUNTER — Other Ambulatory Visit: Payer: Self-pay | Admitting: Nurse Practitioner

## 2021-06-13 ENCOUNTER — Telehealth: Payer: Self-pay

## 2021-06-28 ENCOUNTER — Other Ambulatory Visit (HOSPITAL_COMMUNITY): Payer: Self-pay

## 2021-06-28 MED ORDER — INFLUENZA VAC A&B SA ADJ QUAD 0.5 ML IM PRSY
PREFILLED_SYRINGE | INTRAMUSCULAR | 0 refills | Status: DC
  Filled 2021-06-28: qty 0.5, 1d supply, fill #0

## 2021-07-04 ENCOUNTER — Telehealth: Payer: Self-pay | Admitting: Family Medicine

## 2021-07-04 DIAGNOSIS — I10 Essential (primary) hypertension: Secondary | ICD-10-CM

## 2021-07-04 DIAGNOSIS — E785 Hyperlipidemia, unspecified: Secondary | ICD-10-CM

## 2021-07-04 DIAGNOSIS — E039 Hypothyroidism, unspecified: Secondary | ICD-10-CM

## 2021-07-04 DIAGNOSIS — Z79899 Other long term (current) drug therapy: Secondary | ICD-10-CM

## 2021-07-04 NOTE — Telephone Encounter (Signed)
Patient has appointment 11/22 and needs lab orders. Please advise.  CB#  [phone]

## 2021-07-04 NOTE — Telephone Encounter (Signed)
Last labs completed 06/10/20 hepatic and lipid. Please advise. Thank you

## 2021-07-05 NOTE — Telephone Encounter (Signed)
Please order labs: CBC, CMP+GFR, Lipid, TSH.  Thank you  Dr. Lacinda Axon

## 2021-07-06 NOTE — Telephone Encounter (Signed)
Lab orders placed and pt is aware 

## 2021-07-10 DIAGNOSIS — E785 Hyperlipidemia, unspecified: Secondary | ICD-10-CM | POA: Diagnosis not present

## 2021-07-10 DIAGNOSIS — I10 Essential (primary) hypertension: Secondary | ICD-10-CM | POA: Diagnosis not present

## 2021-07-10 DIAGNOSIS — E039 Hypothyroidism, unspecified: Secondary | ICD-10-CM | POA: Diagnosis not present

## 2021-07-10 DIAGNOSIS — Z79899 Other long term (current) drug therapy: Secondary | ICD-10-CM | POA: Diagnosis not present

## 2021-07-11 ENCOUNTER — Other Ambulatory Visit: Payer: Self-pay | Admitting: Nurse Practitioner

## 2021-07-11 LAB — CMP14+EGFR
ALT: 10 IU/L (ref 0–32)
AST: 10 IU/L (ref 0–40)
Albumin/Globulin Ratio: 2 (ref 1.2–2.2)
Albumin: 4.1 g/dL (ref 3.7–4.7)
Alkaline Phosphatase: 69 IU/L (ref 44–121)
BUN/Creatinine Ratio: 16 (ref 12–28)
BUN: 13 mg/dL (ref 8–27)
Bilirubin Total: 0.3 mg/dL (ref 0.0–1.2)
CO2: 25 mmol/L (ref 20–29)
Calcium: 8.8 mg/dL (ref 8.7–10.3)
Chloride: 100 mmol/L (ref 96–106)
Creatinine, Ser: 0.81 mg/dL (ref 0.57–1.00)
Globulin, Total: 2.1 g/dL (ref 1.5–4.5)
Glucose: 109 mg/dL — ABNORMAL HIGH (ref 70–99)
Potassium: 4.4 mmol/L (ref 3.5–5.2)
Sodium: 137 mmol/L (ref 134–144)
Total Protein: 6.2 g/dL (ref 6.0–8.5)
eGFR: 74 mL/min/{1.73_m2} (ref >59)

## 2021-07-11 LAB — CBC WITH DIFFERENTIAL/PLATELET
Basophils Absolute: 0.1 10*3/uL (ref 0.0–0.2)
Basos: 1 %
EOS (ABSOLUTE): 0.3 10*3/uL (ref 0.0–0.4)
Eos: 4 %
Hematocrit: 39.5 % (ref 34.0–46.6)
Hemoglobin: 13.1 g/dL (ref 11.1–15.9)
Immature Grans (Abs): 0 10*3/uL (ref 0.0–0.1)
Immature Granulocytes: 0 %
Lymphocytes Absolute: 2.1 10*3/uL (ref 0.7–3.1)
Lymphs: 29 %
MCH: 29.6 pg (ref 26.6–33.0)
MCHC: 33.2 g/dL (ref 31.5–35.7)
MCV: 89 fL (ref 79–97)
Monocytes Absolute: 0.6 10*3/uL (ref 0.1–0.9)
Monocytes: 8 %
Neutrophils Absolute: 4.2 10*3/uL (ref 1.4–7.0)
Neutrophils: 58 %
Platelets: 347 10*3/uL (ref 150–450)
RBC: 4.42 x10E6/uL (ref 3.77–5.28)
RDW: 12.2 % (ref 11.7–15.4)
WBC: 7.2 10*3/uL (ref 3.4–10.8)

## 2021-07-11 LAB — LIPID PANEL
Chol/HDL Ratio: 2.9 ratio (ref 0.0–4.4)
Cholesterol, Total: 163 mg/dL (ref 100–199)
HDL: 57 mg/dL (ref >39)
LDL Chol Calc (NIH): 90 mg/dL (ref 0–99)
Triglycerides: 85 mg/dL (ref 0–149)
VLDL Cholesterol Cal: 16 mg/dL (ref 5–40)

## 2021-07-11 LAB — TSH: TSH: 1.61 u[IU]/mL (ref 0.450–4.500)

## 2021-07-18 ENCOUNTER — Other Ambulatory Visit: Payer: Self-pay

## 2021-07-18 ENCOUNTER — Ambulatory Visit (INDEPENDENT_AMBULATORY_CARE_PROVIDER_SITE_OTHER): Payer: Medicare Other | Admitting: Family Medicine

## 2021-07-18 ENCOUNTER — Encounter: Payer: Self-pay | Admitting: Family Medicine

## 2021-07-18 VITALS — BP 169/83 | HR 79 | Temp 90.1°F | Ht 63.5 in | Wt 159.2 lb

## 2021-07-18 DIAGNOSIS — E785 Hyperlipidemia, unspecified: Secondary | ICD-10-CM | POA: Diagnosis not present

## 2021-07-18 DIAGNOSIS — E039 Hypothyroidism, unspecified: Secondary | ICD-10-CM | POA: Diagnosis not present

## 2021-07-18 DIAGNOSIS — Z23 Encounter for immunization: Secondary | ICD-10-CM

## 2021-07-18 DIAGNOSIS — I10 Essential (primary) hypertension: Secondary | ICD-10-CM | POA: Diagnosis not present

## 2021-07-18 MED ORDER — LEVOTHYROXINE SODIUM 75 MCG PO TABS: ORAL_TABLET | ORAL | 1 refills | Status: DC

## 2021-07-18 MED ORDER — ROSUVASTATIN CALCIUM 5 MG PO TABS: 5.0000 mg | ORAL_TABLET | Freq: Every day | ORAL | 1 refills | Status: DC

## 2021-07-18 NOTE — Assessment & Plan Note (Signed)
Stable.  Continue Crestor.  I changed the dosing to daily.

## 2021-07-18 NOTE — Progress Notes (Signed)
Subjective:  Patient ID: Meghan Ortiz, female    DOB: [birth year]  Age: 80 y.o. MRN: [id number]  CC: Chief Complaint  Patient presents with   Establish Care    Refill of meds-90 days   HPI:  80 year old female with hypertension, hypothyroidism, prediabetes, hyperlipidemia presents for follow-up.  Hypertension Patient states that her home blood pressure readings are well controlled on enalapril.  She states that her home blood pressure readings are typically in the 120s or 130's/70's. She states that her blood pressures are often elevated at physician's offices.   Hyperlipidemia Stable on Crestor.  No side effects.  Hypothyroidism Most recent TSH was within normal limits.  She states that she alternates between 75 MCG and 50 MCG of Synthroid.  She would like to consolidate to just 1 dosing if possible.   Patient Active Problem List   Diagnosis Date Noted   Prediabetes 03/20/2018   Essential hypertension 01/22/2018   Hyperlipidemia 01/22/2018   Hypothyroidism 01/22/2018    Social Hx   Social History   Socioeconomic History   Marital status: Widowed    Spouse name: Not on file   Number of children: Not on file   Years of education: Not on file   Highest education level: Not on file  Occupational History   Not on file  Tobacco Use   Smoking status: Never   Smokeless tobacco: Never  Vaping Use   Vaping Use: Never used  Substance and Sexual Activity   Alcohol use: Never   Drug use: Never   Sexual activity: Not Currently    Birth control/protection: Post-menopausal  Other Topics Concern   Not on file  Social History Narrative   Not on file   Social Determinants of Health   Financial Resource Strain: Not on file  Food Insecurity: Not on file  Transportation Needs: Not on file  Physical Activity: Not on file  Stress: Not on file  Social Connections: Not on file    Review of Systems  Constitutional: Negative.   Respiratory: Negative.     Cardiovascular: Negative.     Objective:  BP (!) 169/83   Pulse 79   Temp (!) 90.1 F (32.3 C)   Ht 5' 3.5" (1.613 m)   Wt 159 lb 3.2 oz (72.2 kg)   SpO2 97%   BMI 27.76 kg/m   BP/Weight 07/18/2021 10/21/2020 1/93/7902  Systolic BP 409 735 329  Diastolic BP 83 72 70  Wt. (Lbs) 159.2 159 161  BMI 27.76 27.72 28.07    Physical Exam Vitals and nursing note reviewed.  Constitutional:      General: She is not in acute distress.    Appearance: Normal appearance. She is not ill-appearing.  HENT:     Head: Normocephalic and atraumatic.  Eyes:     General:        Right eye: No discharge.        Left eye: No discharge.     Conjunctiva/sclera: Conjunctivae normal.  Cardiovascular:     Rate and Rhythm: Normal rate and regular rhythm.     Heart sounds: No murmur heard. Pulmonary:     Effort: Pulmonary effort is normal.     Breath sounds: Normal breath sounds. No wheezing, rhonchi or rales.  Abdominal:     General: There is no distension.     Palpations: Abdomen is soft.     Tenderness: There is no abdominal tenderness.  Neurological:     Mental Status: She is alert.  Psychiatric:  Mood and Affect: Mood normal.        Behavior: Behavior normal.    Lab Results  Component Value Date   WBC 7.2 07/10/2021   HGB 13.1 07/10/2021   HCT 39.5 07/10/2021   PLT 347 07/10/2021   GLUCOSE 109 (H) 07/10/2021   CHOL 163 07/10/2021   TRIG 85 07/10/2021   HDL 57 07/10/2021   LDLCALC 90 07/10/2021   ALT 10 07/10/2021   AST 10 07/10/2021   NA 137 07/10/2021   K 4.4 07/10/2021   CL 100 07/10/2021   CREATININE 0.81 07/10/2021   BUN 13 07/10/2021   CO2 25 07/10/2021   TSH 1.610 07/10/2021   HGBA1C 5.2 04/15/2020     Assessment & Plan:   Problem List Items Addressed This Visit       Cardiovascular and Mediastinum   Essential hypertension - Primary    Stable home readings.  Continue enalapril.      Relevant Medications   rosuvastatin (CRESTOR) 5 MG tablet      Endocrine   Hypothyroidism    Changed Synthroid dosing to 75 MCG 6 days a week.      Relevant Medications   levothyroxine (SYNTHROID) 75 MCG tablet     Other   Hyperlipidemia    Stable.  Continue Crestor.  I changed the dosing to daily.      Relevant Medications   rosuvastatin (CRESTOR) 5 MG tablet   Other Visit Diagnoses     Need for vaccination       Relevant Orders   Flu Vaccine QUAD High Dose(Fluad) (Completed)       Meds ordered this encounter  Medications   levothyroxine (SYNTHROID) 75 MCG tablet    Sig: 75 mcg daily Monday through Saturday in the am on an empty stomach with water. No dose on Sundays.    Dispense:  90 tablet    Refill:  1   rosuvastatin (CRESTOR) 5 MG tablet    Sig: Take 1 tablet (5 mg total) by mouth daily. TAKE ONE TABLET BY MOUTH ON MONDAYS, WEDNESDAYS, AND FRIDAYS FOR CHOLESTEROL.    Dispense:  90 tablet    Refill:  1    Follow-up:  Return in about 6 months (around 01/15/2022).  [place]

## 2021-07-18 NOTE — Patient Instructions (Addendum)
Keep an eye on your BP.  75 mcg of Synthroid 6 days a week. Take with water on empty stomach. No other meds (or vitamins, milk, etc) within 30-60 mins.   Take care  Dr. Lacinda Axon

## 2021-07-18 NOTE — Assessment & Plan Note (Signed)
Stable home readings.  Continue enalapril.

## 2021-07-18 NOTE — Assessment & Plan Note (Signed)
Changed Synthroid dosing to 75 MCG 6 days a week.

## 2021-08-03 DIAGNOSIS — Z20822 Contact with and (suspected) exposure to covid-19: Secondary | ICD-10-CM | POA: Diagnosis not present

## 2021-09-04 ENCOUNTER — Telehealth: Payer: Self-pay | Admitting: Family Medicine

## 2021-09-04 NOTE — Telephone Encounter (Signed)
Pt is in the office. She went to get bloodwork and they do not have the orders. Please send labwork orders.

## 2021-09-04 NOTE — Telephone Encounter (Signed)
Patient does not need blood work at this time per Dr Lacinda Axon. Patient notified.

## 2021-09-26 ENCOUNTER — Telehealth: Payer: Self-pay | Admitting: Family Medicine

## 2021-09-26 NOTE — Telephone Encounter (Signed)
°  Left message for patient to call back and schedule Medicare Annual Wellness Visit (AWV) in office.   If unable to come into the office for AWV,  please offer to do virtually or by telephone.  No hx of AWV eligible for AWVI as of 03/27/2008 per [facility]  Please schedule at anytime with [facility]-Nurse Health Advisor.      40 Minutes appointment   Any questions, please call me at [phone]

## 2021-10-02 ENCOUNTER — Other Ambulatory Visit: Payer: Self-pay | Admitting: Family Medicine

## 2021-11-11 DIAGNOSIS — Z20822 Contact with and (suspected) exposure to covid-19: Secondary | ICD-10-CM | POA: Diagnosis not present

## 2021-11-16 ENCOUNTER — Telehealth: Payer: Self-pay | Admitting: Family Medicine

## 2021-11-16 NOTE — Telephone Encounter (Signed)
?  Left message for patient to call back and schedule Medicare Annual Wellness Visit (AWV) in office.  ? ?If unable to come into the office for AWV,  please offer to do virtually or by telephone. ? ?No hx of AWV eligible for AWVI per [facility] as of 03/27/2008  ? ?Please schedule at anytime with [facility]-Nurse Health Advisor.     ? ?45 minute appointment  ? ?Any questions, please call me at [phone]   ?

## 2021-12-11 DIAGNOSIS — Z20822 Contact with and (suspected) exposure to covid-19: Secondary | ICD-10-CM | POA: Diagnosis not present

## 2021-12-30 DIAGNOSIS — Z20822 Contact with and (suspected) exposure to covid-19: Secondary | ICD-10-CM | POA: Diagnosis not present

## 2022-01-01 DIAGNOSIS — Z20822 Contact with and (suspected) exposure to covid-19: Secondary | ICD-10-CM | POA: Diagnosis not present

## 2022-01-02 ENCOUNTER — Other Ambulatory Visit: Payer: Self-pay | Admitting: Family Medicine

## 2022-01-16 ENCOUNTER — Ambulatory Visit (INDEPENDENT_AMBULATORY_CARE_PROVIDER_SITE_OTHER): Payer: Medicare Other | Admitting: Family Medicine

## 2022-01-16 VITALS — BP 133/75 | HR 72 | Temp 98.4°F | Ht 63.5 in | Wt 164.4 lb

## 2022-01-16 DIAGNOSIS — I10 Essential (primary) hypertension: Secondary | ICD-10-CM

## 2022-01-16 DIAGNOSIS — E785 Hyperlipidemia, unspecified: Secondary | ICD-10-CM

## 2022-01-16 DIAGNOSIS — M810 Age-related osteoporosis without current pathological fracture: Secondary | ICD-10-CM | POA: Diagnosis not present

## 2022-01-16 DIAGNOSIS — Z79899 Other long term (current) drug therapy: Secondary | ICD-10-CM

## 2022-01-16 DIAGNOSIS — L989 Disorder of the skin and subcutaneous tissue, unspecified: Secondary | ICD-10-CM | POA: Diagnosis not present

## 2022-01-16 DIAGNOSIS — E039 Hypothyroidism, unspecified: Secondary | ICD-10-CM | POA: Diagnosis not present

## 2022-01-16 DIAGNOSIS — R7303 Prediabetes: Secondary | ICD-10-CM

## 2022-01-16 NOTE — Assessment & Plan Note (Signed)
Stable.  Continue enalapril.  Labs ordered.

## 2022-01-16 NOTE — Assessment & Plan Note (Signed)
Well controlled. Continue Crestor. 

## 2022-01-16 NOTE — Patient Instructions (Signed)
I am placing a referral to dermatology.  Continue your meds.  Follow up in 6 months to 1 year.  Take care  Dr. Lacinda Axon

## 2022-01-16 NOTE — Progress Notes (Signed)
Subjective:  Patient ID: Meghan Ortiz, female    DOB: 04-23-1941  Age: 81 y.o. MRN: [id number]  CC: Chief Complaint  Patient presents with   Hyperlipidemia   Hypertension   Hypothyroidism    HPI:  81 year old female presents for follow-up.  Hypertension is stable on enalapril.  Patient remains on Boniva for osteoporosis from her OB/GYN.  Lipids have been at goal.  Needs labs.  Patient is currently on Crestor 5 mg and tolerating.  Hypothyroidism has been stable on Synthroid 75 mcg daily.  Needs labs.  Patient is doing well.  She has no significant complaints today.  Patient Active Problem List   Diagnosis Date Noted   Osteoporosis 01/16/2022   Prediabetes 03/20/2018   Essential hypertension 01/22/2018   Hyperlipidemia 01/22/2018   Hypothyroidism 01/22/2018    Social Hx   Social History   Socioeconomic History   Marital status: Widowed    Spouse name: Not on file   Number of children: Not on file   Years of education: Not on file   Highest education level: Not on file  Occupational History   Not on file  Tobacco Use   Smoking status: Never   Smokeless tobacco: Never  Vaping Use   Vaping Use: Never used  Substance and Sexual Activity   Alcohol use: Never   Drug use: Never   Sexual activity: Not Currently    Birth control/protection: Post-menopausal  Other Topics Concern   Not on file  Social History Narrative   Not on file   Social Determinants of Health   Financial Resource Strain: Not on file  Food Insecurity: Not on file  Transportation Needs: Not on file  Physical Activity: Not on file  Stress: Not on file  Social Connections: Not on file    Review of Systems  Constitutional: Negative.   Respiratory: Negative.    Cardiovascular: Negative.     Objective:  BP 133/75   Pulse 72   Temp 98.4 F (36.9 C) (Oral)   Ht 5' 3.5" (1.613 m)   Wt 164 lb 6.4 oz (74.6 kg)   SpO2 98%   BMI 28.67 kg/m      01/16/2022    9:52 AM 07/18/2021    10:49 AM 10/21/2020    1:56 PM  BP/Weight  Systolic BP 409 811 914  Diastolic BP 75 83 72  Wt. (Lbs) 164.4 159.2 159  BMI 28.67 kg/m2 27.76 kg/m2 27.72 kg/m2    Physical Exam Constitutional:      General: She is not in acute distress.    Appearance: Normal appearance. She is not ill-appearing.  HENT:     Head: Normocephalic and atraumatic.  Eyes:     General:        Right eye: No discharge.        Left eye: No discharge.     Pupils: Pupils are equal, round, and reactive to light.  Cardiovascular:     Rate and Rhythm: Normal rate and regular rhythm.  Pulmonary:     Effort: Pulmonary effort is normal.     Breath sounds: Normal breath sounds. No wheezing, rhonchi or rales.  Abdominal:     General: There is no distension.     Palpations: Abdomen is soft.     Tenderness: There is no abdominal tenderness.  Skin:    Comments: Raised, hyperkeratotic skin lesion noted on the anterior chest.  Neurological:     Mental Status: She is alert.  Psychiatric:  Mood and Affect: Mood normal.        Behavior: Behavior normal.    Lab Results  Component Value Date   WBC 7.2 07/10/2021   HGB 13.1 07/10/2021   HCT 39.5 07/10/2021   PLT 347 07/10/2021   GLUCOSE 109 (H) 07/10/2021   CHOL 163 07/10/2021   TRIG 85 07/10/2021   HDL 57 07/10/2021   LDLCALC 90 07/10/2021   ALT 10 07/10/2021   AST 10 07/10/2021   NA 137 07/10/2021   K 4.4 07/10/2021   CL 100 07/10/2021   CREATININE 0.81 07/10/2021   BUN 13 07/10/2021   CO2 25 07/10/2021   TSH 1.610 07/10/2021   HGBA1C 5.2 04/15/2020     Assessment & Plan:   Problem List Items Addressed This Visit       Cardiovascular and Mediastinum   Essential hypertension - Primary    Stable.  Continue enalapril.  Labs ordered.       Relevant Orders   CMP14+EGFR     Endocrine   Hypothyroidism    Stable.  Continue current dosing of Synthroid.  Awaiting TSH.       Relevant Orders   TSH     Musculoskeletal and Integument    Osteoporosis     Other   Prediabetes   Relevant Orders   Hemoglobin A1c   Hyperlipidemia    Well-controlled.  Continue Crestor.       Relevant Orders   Lipid panel   Other Visit Diagnoses     High risk medication use       Relevant Orders   CBC   Skin lesion       Relevant Orders   Ambulatory referral to Dermatology      Follow-up:  6 months to 1 year.  [place]

## 2022-01-16 NOTE — Assessment & Plan Note (Signed)
Stable.  Continue current dosing of Synthroid.  Awaiting TSH.

## 2022-01-17 LAB — CMP14+EGFR
ALT: 12 IU/L (ref 0–32)
AST: 13 IU/L (ref 0–40)
Albumin/Globulin Ratio: 1.6 (ref 1.2–2.2)
Albumin: 4.1 g/dL (ref 3.7–4.7)
Alkaline Phosphatase: 80 IU/L (ref 44–121)
BUN/Creatinine Ratio: 15 (ref 12–28)
BUN: 12 mg/dL (ref 8–27)
Bilirubin Total: 0.2 mg/dL (ref 0.0–1.2)
CO2: 24 mmol/L (ref 20–29)
Calcium: 9.7 mg/dL (ref 8.7–10.3)
Chloride: 101 mmol/L (ref 96–106)
Creatinine, Ser: 0.81 mg/dL (ref 0.57–1.00)
Globulin, Total: 2.5 g/dL (ref 1.5–4.5)
Glucose: 91 mg/dL (ref 70–99)
Potassium: 5 mmol/L (ref 3.5–5.2)
Sodium: 138 mmol/L (ref 134–144)
Total Protein: 6.6 g/dL (ref 6.0–8.5)
eGFR: 74 mL/min/{1.73_m2} (ref >59)

## 2022-01-17 LAB — LIPID PANEL
Chol/HDL Ratio: 3.1 ratio (ref 0.0–4.4)
Cholesterol, Total: 167 mg/dL (ref 100–199)
HDL: 54 mg/dL (ref >39)
LDL Chol Calc (NIH): 95 mg/dL (ref 0–99)
Triglycerides: 96 mg/dL (ref 0–149)
VLDL Cholesterol Cal: 18 mg/dL (ref 5–40)

## 2022-01-17 LAB — CBC
Hematocrit: 39.4 % (ref 34.0–46.6)
Hemoglobin: 13.4 g/dL (ref 11.1–15.9)
MCH: 30 pg (ref 26.6–33.0)
MCHC: 34 g/dL (ref 31.5–35.7)
MCV: 88 fL (ref 79–97)
Platelets: 374 10*3/uL (ref 150–450)
RBC: 4.47 x10E6/uL (ref 3.77–5.28)
RDW: 12.6 % (ref 11.7–15.4)
WBC: 7.3 10*3/uL (ref 3.4–10.8)

## 2022-01-17 LAB — TSH: TSH: 1.04 u[IU]/mL (ref 0.450–4.500)

## 2022-01-17 LAB — HEMOGLOBIN A1C
Est. average glucose Bld gHb Est-mCnc: 117 mg/dL
Hgb A1c MFr Bld: 5.7 % — ABNORMAL HIGH (ref 4.8–5.6)

## 2022-02-02 ENCOUNTER — Encounter (INDEPENDENT_AMBULATORY_CARE_PROVIDER_SITE_OTHER): Payer: Medicare Other | Admitting: Ophthalmology

## 2022-02-02 DIAGNOSIS — H43813 Vitreous degeneration, bilateral: Secondary | ICD-10-CM

## 2022-02-02 DIAGNOSIS — D3131 Benign neoplasm of right choroid: Secondary | ICD-10-CM | POA: Diagnosis not present

## 2022-02-13 DIAGNOSIS — C44529 Squamous cell carcinoma of skin of other part of trunk: Secondary | ICD-10-CM | POA: Diagnosis not present

## 2022-03-08 ENCOUNTER — Other Ambulatory Visit: Payer: Self-pay | Admitting: Family Medicine

## 2022-04-02 ENCOUNTER — Other Ambulatory Visit: Payer: Self-pay | Admitting: Family Medicine

## 2022-04-03 DIAGNOSIS — Z85828 Personal history of other malignant neoplasm of skin: Secondary | ICD-10-CM | POA: Diagnosis not present

## 2022-04-03 DIAGNOSIS — C44729 Squamous cell carcinoma of skin of left lower limb, including hip: Secondary | ICD-10-CM | POA: Diagnosis not present

## 2022-04-03 DIAGNOSIS — Z08 Encounter for follow-up examination after completed treatment for malignant neoplasm: Secondary | ICD-10-CM | POA: Diagnosis not present

## 2022-04-03 DIAGNOSIS — D0472 Carcinoma in situ of skin of left lower limb, including hip: Secondary | ICD-10-CM | POA: Diagnosis not present

## 2022-04-20 DIAGNOSIS — D3131 Benign neoplasm of right choroid: Secondary | ICD-10-CM | POA: Diagnosis not present

## 2022-05-15 DIAGNOSIS — Z85828 Personal history of other malignant neoplasm of skin: Secondary | ICD-10-CM | POA: Diagnosis not present

## 2022-05-15 DIAGNOSIS — Z08 Encounter for follow-up examination after completed treatment for malignant neoplasm: Secondary | ICD-10-CM | POA: Diagnosis not present

## 2022-06-12 ENCOUNTER — Ambulatory Visit (INDEPENDENT_AMBULATORY_CARE_PROVIDER_SITE_OTHER): Payer: Medicare Other

## 2022-06-12 VITALS — Ht 63.5 in | Wt 164.0 lb

## 2022-06-12 DIAGNOSIS — Z0000 Encounter for general adult medical examination without abnormal findings: Secondary | ICD-10-CM

## 2022-06-12 DIAGNOSIS — M8580 Other specified disorders of bone density and structure, unspecified site: Secondary | ICD-10-CM | POA: Insufficient documentation

## 2022-06-12 NOTE — Progress Notes (Signed)
Virtual Visit via Telephone Note  I connected with  Meghan Ortiz on 06/12/22 at  8:45 AM EDT by telephone and verified that I am speaking with the correct person using two identifiers.  Location: Patient: home Provider: [facility] Persons participating in the virtual visit: patient/Nurse Health Advisor   I discussed the limitations, risks, security and privacy concerns of performing an evaluation and management service by telephone and the availability of in person appointments. The patient expressed understanding and agreed to proceed.  Interactive audio and video telecommunications were attempted between this nurse and patient, however failed, due to patient having technical difficulties OR patient did not have access to video capability.  We continued and completed visit with audio only.  Some vital signs may be absent or patient reported.   Dionisio David, LPN  Subjective:   Meghan Ortiz is a 81 y.o. female who presents for Medicare Annual (Subsequent) preventive examination.  Review of Systems     Cardiac Risk Factors include: advanced age (>52mn, >>22women);hypertension     Objective:    There were no vitals filed for this visit. There is no height or weight on file to calculate BMI.     06/12/2022    8:47 AM  Advanced Directives  Does Patient Have a Medical Advance Directive? No  Would patient like information on creating a medical advance directive? No - Patient declined    Current Medications (verified) Outpatient Encounter Medications as of 06/12/2022  Medication Sig   alendronate (FOSAMAX) 70 MG tablet alendronate 70 mg tablet   calcium carbonate (OSCAL) 1500 (600 Ca) MG TABS tablet Take by mouth.   Cholecalciferol 50 MCG (2000 UT) TABS Take by mouth.   enalapril (VASOTEC) 5 MG tablet TAKE ONE TABLET (5 MG TOTAL) BY MOUTH ATBEDTIME.   ibandronate (BONIVA) 150 MG tablet Take 150 mg by mouth every 30 (thirty) days.   lansoprazole (PREVACID) 15 MG capsule  Take by mouth.   levothyroxine (SYNTHROID) 50 MCG tablet TAKE ONE TABLET BY MOUTH EVERY OTHER DAY, ALTERNATING WITH 75 MCG.   levothyroxine (SYNTHROID) 75 MCG tablet TAKE ONE TABLET (75MCG) BY MOUTH DAILY MONDAY THROUGH SATURDAY IN THE MORNING ON AN EMPTY STOMACH WITH WATER. NO DOSE ON SUNDAYS.   mupirocin ointment (BACTROBAN) 2 % Apply topically 3 (three) times daily as needed.   rosuvastatin (CRESTOR) 5 MG tablet Take 1 tablet (5 mg total) by mouth daily. TAKE ONE TABLET BY MOUTH ON MONDAYS, WEDNESDAYS, AND FRIDAYS FOR CHOLESTEROL.   venlafaxine XR (EFFEXOR-XR) 75 MG 24 hr capsule Take by mouth.   vitamin B-12 (CYANOCOBALAMIN) 500 MCG tablet Take 500 mcg by mouth daily.   VITAMIN E PO Take by mouth.   [DISCONTINUED] lansoprazole (PREVACID) 15 MG capsule Take by mouth.   No facility-administered encounter medications on file as of 06/12/2022.    Allergies (verified) Patient has no known allergies.   History: History reviewed. No pertinent past medical history. History reviewed. No pertinent surgical history. History reviewed. No pertinent family history. Social History   Socioeconomic History   Marital status: Widowed    Spouse name: Not on file   Number of children: Not on file   Years of education: Not on file   Highest education level: Not on file  Occupational History   Not on file  Tobacco Use   Smoking status: Never   Smokeless tobacco: Never  Vaping Use   Vaping Use: Never used  Substance and Sexual Activity   Alcohol use: Never  Drug use: Never   Sexual activity: Not Currently    Birth control/protection: Post-menopausal  Other Topics Concern   Not on file  Social History Narrative   Not on file   Social Determinants of Health   Financial Resource Strain: Low Risk  (06/12/2022)   Overall Financial Resource Strain (CARDIA)    Difficulty of Paying Living Expenses: Not hard at all  Food Insecurity: No Food Insecurity (06/12/2022)   Hunger Vital Sign     Worried About Running Out of Food in the Last Year: Never true    Ran Out of Food in the Last Year: Never true  Transportation Needs: No Transportation Needs (06/12/2022)   PRAPARE - Hydrologist (Medical): No    Lack of Transportation (Non-Medical): No  Physical Activity: Sufficiently Active (06/12/2022)   Exercise Vital Sign    Days of Exercise per Week: 3 days    Minutes of Exercise per Session: 60 min  Stress: No Stress Concern Present (06/12/2022)   [place]    Feeling of Stress : Not at all  Social Connections: Moderately Isolated (06/12/2022)   Social Connection and Isolation Panel [NHANES]    Frequency of Communication with Friends and Family: More than three times a week    Frequency of Social Gatherings with Friends and Family: More than three times a week    Attends Religious Services: More than 4 times per year    Active Member of [employer] or Organizations: No    Attends Archivist Meetings: Never    Marital Status: Widowed    Tobacco Counseling Counseling given: Not Answered   Clinical Intake:  Pre-visit preparation completed: Yes  Pain : No/denies pain     Nutritional Risks: None Diabetes: No  How often do you need to have someone help you when you read instructions, pamphlets, or other written materials from your doctor or pharmacy?: 1 - Never  Diabetic?no  Interpreter Needed?: No  Information entered by :: Kirke Shaggy, LPN   Activities of Daily Living    06/12/2022    8:48 AM  In your present state of health, do you have any difficulty performing the following activities:  Hearing? 0  Vision? 0  Difficulty concentrating or making decisions? 0  Walking or climbing stairs? 0  Dressing or bathing? 0  Doing errands, shopping? 0  Preparing Food and eating ? N  Using the Toilet? N  In the past six months, have you accidently leaked urine? N   Do you have problems with loss of bowel control? N  Managing your Medications? N  Managing your Finances? N  Housekeeping or managing your Housekeeping? N    Patient Care Team: Coral Spikes, DO as PCP - General (Family Medicine)  Indicate any recent Medical Services you may have received from other than [facility] providers in the past year (date may be approximate).     Assessment:   This is a routine wellness examination for [place].  Hearing/Vision screen Hearing Screening - Comments:: No aids Vision Screening - Comments:: Readers- Dr. Zigmund Daniel in [place]  Dietary issues and exercise activities discussed: Current Exercise Habits: Home exercise routine, Type of exercise: walking, Time (Minutes): 60, Frequency (Times/Week): 4, Weekly Exercise (Minutes/Week): 240, Intensity: Mild   Goals Addressed             This Visit's Progress    DIET - EAT MORE FRUITS AND VEGETABLES  Depression Screen    06/12/2022    8:46 AM 07/18/2021   10:49 AM 04/15/2020   10:19 AM 03/20/2018    9:00 AM  PHQ 2/9 Scores  PHQ - 2 Score 0 0 0 0  PHQ- 9 Score 0   0    Fall Risk    06/12/2022    8:47 AM 07/18/2021   10:48 AM 10/26/2019    8:28 AM  [place] in the past year? 0 0 0  Number falls in past yr: 0 0 0  Injury with Fall? 0 0 0  Risk for fall due to : No Fall Risks No Fall Risks   Follow up Falls prevention discussed;Falls evaluation completed Falls evaluation completed Falls evaluation completed    FALL RISK PREVENTION PERTAINING TO THE HOME:  Any stairs in or around the home? No  If so, are there any without handrails? No  Home free of loose throw rugs in walkways, pet beds, electrical cords, etc? Yes  Adequate lighting in your home to reduce risk of falls? Yes   ASSISTIVE DEVICES UTILIZED TO PREVENT FALLS:  Life alert? No  Use of a cane, walker or w/c? No  Grab bars in the bathroom? No  Shower chair or bench in shower? Yes  Elevated toilet seat or a handicapped  toilet? No   Cognitive Function:        06/12/2022    8:48 AM  6CIT Screen  What Year? 0 points  What month? 0 points  What time? 0 points  Count back from 20 0 points  Months in reverse 0 points  Repeat phrase 0 points  Total Score 0 points    Immunizations Immunization History  Administered Date(s) Administered   Fluad Quad(high Dose 65+) 07/18/2021   Influenza Split 05/28/2016   Influenza,inj,Quad PF,6+ Mos 06/10/2019, 06/05/2020   Influenza,trivalent, recombinat, inj, PF 06/08/2015, 05/30/2017   Influenza-Unspecified 06/05/2018   Moderna Sars-Covid-2 Vaccination 09/11/2019, 10/12/2019, 05/12/2020    TDAP status: Due, Education has been provided regarding the importance of this vaccine. Advised may receive this vaccine at local pharmacy or Health Dept. Aware to provide a copy of the vaccination record if obtained from local pharmacy or Health Dept. Verbalized acceptance and understanding.  Flu Vaccine status: Up to date  Pneumococcal vaccine status: Declined,  Education has been provided regarding the importance of this vaccine but patient still declined. Advised may receive this vaccine at local pharmacy or Health Dept. Aware to provide a copy of the vaccination record if obtained from local pharmacy or Health Dept. Verbalized acceptance and understanding.   Covid-19 vaccine status: Completed vaccines  Qualifies for Shingles Vaccine? Yes   Zostavax completed No   Shingrix Completed?: No.    Education has been provided regarding the importance of this vaccine. Patient has been advised to call insurance company to determine out of pocket expense if they have not yet received this vaccine. Advised may also receive vaccine at local pharmacy or Health Dept. Verbalized acceptance and understanding.  Screening Tests Health Maintenance  Topic Date Due   TETANUS/TDAP  Never done   Pneumonia Vaccine 34+ Years old (1 - PCV) Never done   INFLUENZA VACCINE  03/27/2022   DEXA  SCAN  Completed   HPV VACCINES  Aged Out   COLONOSCOPY (Pts 45-62yr Insurance coverage will need to be confirmed)  Discontinued   COVID-19 Vaccine  Discontinued   Hepatitis C Screening  Discontinued   Zoster Vaccines- Shingrix  Discontinued  Health Maintenance  Health Maintenance Due  Topic Date Due   TETANUS/TDAP  Never done   Pneumonia Vaccine 16+ Years old (1 - PCV) Never done   INFLUENZA VACCINE  03/27/2022    Colorectal cancer screening: No longer required.   Mammogram status: No longer required due to age.  Bone Density status: Completed 08/28/19. Results reflect: Bone density results: OSTEOPENIA. Repeat every 5 years.- has appt next month  Lung Cancer Screening: (Low Dose CT Chest recommended if Age 84-80 years, 30 pack-year currently smoking OR have quit w/in 15years.) does not qualify.    Additional Screening:  Hepatitis C Screening: does qualify; Completed no  Vision Screening: Recommended annual ophthalmology exams for early detection of glaucoma and other disorders of the eye. Is the patient up to date with their annual eye exam?  Yes  Who is the provider or what is the name of the office in which the patient attends annual eye exams? Dr.Matthews in [place] If pt is not established with a provider, would they like to be referred to a provider to establish care? No .   Dental Screening: Recommended annual dental exams for proper oral hygiene  Community Resource Referral / Chronic Care Management: CRR required this visit?  No   CCM required this visit?  No      Plan:     I have personally reviewed and noted the following in the patient's chart:   Medical and social history Use of alcohol, tobacco or illicit drugs  Current medications and supplements including opioid prescriptions. Patient is not currently taking opioid prescriptions. Functional ability and status Nutritional status Physical activity Advanced directives List of other  physicians Hospitalizations, surgeries, and ER visits in previous 12 months Vitals Screenings to include cognitive, depression, and falls Referrals and appointments  In addition, I have reviewed and discussed with patient certain preventive protocols, quality metrics, and best practice recommendations. A written personalized care plan for preventive services as well as general preventive health recommendations were provided to patient.     Dionisio David, LPN   [id number]   Nurse Notes: none

## 2022-06-12 NOTE — Patient Instructions (Signed)
Meghan Ortiz , Thank you for taking time to come for your Medicare Wellness Visit. I appreciate your ongoing commitment to your health goals. Please review the following plan we discussed and let me know if I can assist you in the future.   Screening recommendations/referrals: Colonoscopy: aged out Mammogram: aged out Bone Density: 08/28/19  Recommended yearly ophthalmology/optometry visit for glaucoma screening and checkup Recommended yearly dental visit for hygiene and checkup  Vaccinations: Influenza vaccine: 07/18/21 Pneumococcal vaccine: n/d Tdap vaccine: n/d Shingles vaccine: n/d   Covid-19:09/11/19, 10/12/19, 05/12/20  Advanced directives: no  Conditions/risks identified: none  Next appointment: Follow up in one year for your annual wellness visit 06/25/23 @ 9:45 am by phone   Preventive Care 79 Years and Older, Female Preventive care refers to lifestyle choices and visits with your health care provider that can promote health and wellness. What does preventive care include? A yearly physical exam. This is also called an annual well check. Dental exams once or twice a year. Routine eye exams. Ask your health care provider how often you should have your eyes checked. Personal lifestyle choices, including: Daily care of your teeth and gums. Regular physical activity. Eating a healthy diet. Avoiding tobacco and drug use. Limiting alcohol use. Practicing safe sex. Taking low-dose aspirin every day. Taking vitamin and mineral supplements as recommended by your health care provider. What happens during an annual well check? The services and screenings done by your health care provider during your annual well check will depend on your age, overall health, lifestyle risk factors, and family history of disease. Counseling  Your health care provider may ask you questions about your: Alcohol use. Tobacco use. Drug use. Emotional well-being. Home and relationship  well-being. Sexual activity. Eating habits. History of falls. Memory and ability to understand (cognition). Work and work Statistician. Reproductive health. Screening  You may have the following tests or measurements: Height, weight, and BMI. Blood pressure. Lipid and cholesterol levels. These may be checked every 5 years, or more frequently if you are over 39 years old. Skin check. Lung cancer screening. You may have this screening every year starting at age 49 if you have a 30-pack-year history of smoking and currently smoke or have quit within the past 15 years. Fecal occult blood test (FOBT) of the stool. You may have this test every year starting at age 49. Flexible sigmoidoscopy or colonoscopy. You may have a sigmoidoscopy every 5 years or a colonoscopy every 10 years starting at age 59. Hepatitis C blood test. Hepatitis B blood test. Sexually transmitted disease (STD) testing. Diabetes screening. This is done by checking your blood sugar (glucose) after you have not eaten for a while (fasting). You may have this done every 1-3 years. Bone density scan. This is done to screen for osteoporosis. You may have this done starting at age 82. Mammogram. This may be done every 1-2 years. Talk to your health care provider about how often you should have regular mammograms. Talk with your health care provider about your test results, treatment options, and if necessary, the need for more tests. Vaccines  Your health care provider may recommend certain vaccines, such as: Influenza vaccine. This is recommended every year. Tetanus, diphtheria, and acellular pertussis (Tdap, Td) vaccine. You may need a Td booster every 10 years. Zoster vaccine. You may need this after age 31. Pneumococcal 13-valent conjugate (PCV13) vaccine. One dose is recommended after age 56. Pneumococcal polysaccharide (PPSV23) vaccine. One dose is recommended after age 54. Talk to  your health care provider about which  screenings and vaccines you need and how often you need them. This information is not intended to replace advice given to you by your health care provider. Make sure you discuss any questions you have with your health care provider. Document Released: 09/09/2015 Document Revised: 05/02/2016 Document Reviewed: 06/14/2015 Elsevier Interactive Patient Education  2017 [place] Prevention in the Home Falls can cause injuries. They can happen to people of all ages. There are many things you can do to make your home safe and to help prevent falls. What can I do on the outside of my home? Regularly fix the edges of walkways and driveways and fix any cracks. Remove anything that might make you trip as you walk through a door, such as a raised step or threshold. Trim any bushes or trees on the path to your home. Use bright outdoor lighting. Clear any walking paths of anything that might make someone trip, such as rocks or tools. Regularly check to see if handrails are loose or broken. Make sure that both sides of any steps have handrails. Any raised decks and porches should have guardrails on the edges. Have any leaves, snow, or ice cleared regularly. Use sand or salt on walking paths during winter. Clean up any spills in your garage right away. This includes oil or grease spills. What can I do in the bathroom? Use night lights. Install grab bars by the toilet and in the tub and shower. Do not use towel bars as grab bars. Use non-skid mats or decals in the tub or shower. If you need to sit down in the shower, use a plastic, non-slip stool. Keep the floor dry. Clean up any water that spills on the floor as soon as it happens. Remove soap buildup in the tub or shower regularly. Attach bath mats securely with double-sided non-slip rug tape. Do not have throw rugs and other things on the floor that can make you trip. What can I do in the bedroom? Use night lights. Make sure that you have a  light by your bed that is easy to reach. Do not use any sheets or blankets that are too big for your bed. They should not hang down onto the floor. Have a firm chair that has side arms. You can use this for support while you get dressed. Do not have throw rugs and other things on the floor that can make you trip. What can I do in the kitchen? Clean up any spills right away. Avoid walking on wet floors. Keep items that you use a lot in easy-to-reach places. If you need to reach something above you, use a strong step stool that has a grab bar. Keep electrical cords out of the way. Do not use floor polish or wax that makes floors slippery. If you must use wax, use non-skid floor wax. Do not have throw rugs and other things on the floor that can make you trip. What can I do with my stairs? Do not leave any items on the stairs. Make sure that there are handrails on both sides of the stairs and use them. Fix handrails that are broken or loose. Make sure that handrails are as long as the stairways. Check any carpeting to make sure that it is firmly attached to the stairs. Fix any carpet that is loose or worn. Avoid having throw rugs at the top or bottom of the stairs. If you do have throw rugs, attach  them to the floor with carpet tape. Make sure that you have a light switch at the top of the stairs and the bottom of the stairs. If you do not have them, ask someone to add them for you. What else can I do to help prevent falls? Wear shoes that: Do not have high heels. Have rubber bottoms. Are comfortable and fit you well. Are closed at the toe. Do not wear sandals. If you use a stepladder: Make sure that it is fully opened. Do not climb a closed stepladder. Make sure that both sides of the stepladder are locked into place. Ask someone to hold it for you, if possible. Clearly mark and make sure that you can see: Any grab bars or handrails. First and last steps. Where the edge of each step  is. Use tools that help you move around (mobility aids) if they are needed. These include: Canes. Walkers. Scooters. Crutches. Turn on the lights when you go into a dark area. Replace any light bulbs as soon as they burn out. Set up your furniture so you have a clear path. Avoid moving your furniture around. If any of your floors are uneven, fix them. If there are any pets around you, be aware of where they are. Review your medicines with your doctor. Some medicines can make you feel dizzy. This can increase your chance of falling. Ask your doctor what other things that you can do to help prevent falls. This information is not intended to replace advice given to you by your health care provider. Make sure you discuss any questions you have with your health care provider. Document Released: 06/09/2009 Document Revised: 01/19/2016 Document Reviewed: 09/17/2014 Elsevier Interactive Patient Education  2017 [employer].

## 2022-06-25 ENCOUNTER — Other Ambulatory Visit (HOSPITAL_COMMUNITY): Payer: Self-pay | Admitting: Obstetrics and Gynecology

## 2022-06-25 DIAGNOSIS — Z1231 Encounter for screening mammogram for malignant neoplasm of breast: Secondary | ICD-10-CM

## 2022-07-02 ENCOUNTER — Other Ambulatory Visit: Payer: Self-pay | Admitting: Family Medicine

## 2022-07-09 DIAGNOSIS — Z6829 Body mass index (BMI) 29.0-29.9, adult: Secondary | ICD-10-CM | POA: Diagnosis not present

## 2022-07-09 DIAGNOSIS — Z01419 Encounter for gynecological examination (general) (routine) without abnormal findings: Secondary | ICD-10-CM | POA: Diagnosis not present

## 2022-07-09 DIAGNOSIS — N958 Other specified menopausal and perimenopausal disorders: Secondary | ICD-10-CM | POA: Diagnosis not present

## 2022-07-09 DIAGNOSIS — M8588 Other specified disorders of bone density and structure, other site: Secondary | ICD-10-CM | POA: Diagnosis not present

## 2022-07-09 DIAGNOSIS — E039 Hypothyroidism, unspecified: Secondary | ICD-10-CM | POA: Diagnosis not present

## 2022-07-09 DIAGNOSIS — R29890 Loss of height: Secondary | ICD-10-CM | POA: Diagnosis not present

## 2022-07-09 DIAGNOSIS — Z7983 Long term (current) use of bisphosphonates: Secondary | ICD-10-CM | POA: Diagnosis not present

## 2022-07-09 LAB — HM DEXA SCAN

## 2022-07-11 ENCOUNTER — Ambulatory Visit (HOSPITAL_COMMUNITY)
Admission: RE | Admit: 2022-07-11 | Discharge: 2022-07-11 | Disposition: A | Discharge status: Home / Self Care | Payer: Medicare Other | Source: Ambulatory Visit | Attending: Obstetrics and Gynecology | Admitting: Obstetrics and Gynecology

## 2022-07-11 DIAGNOSIS — Z1231 Encounter for screening mammogram for malignant neoplasm of breast: Secondary | ICD-10-CM | POA: Diagnosis not present

## 2022-07-23 ENCOUNTER — Ambulatory Visit (INDEPENDENT_AMBULATORY_CARE_PROVIDER_SITE_OTHER): Payer: Medicare Other | Admitting: Family Medicine

## 2022-07-23 VITALS — BP 132/82 | Ht 63.5 in | Wt 165.8 lb

## 2022-07-23 DIAGNOSIS — Z23 Encounter for immunization: Secondary | ICD-10-CM | POA: Diagnosis not present

## 2022-07-23 DIAGNOSIS — I10 Essential (primary) hypertension: Secondary | ICD-10-CM | POA: Diagnosis not present

## 2022-07-23 DIAGNOSIS — E785 Hyperlipidemia, unspecified: Secondary | ICD-10-CM

## 2022-07-23 DIAGNOSIS — Z0000 Encounter for general adult medical examination without abnormal findings: Secondary | ICD-10-CM

## 2022-07-23 DIAGNOSIS — E039 Hypothyroidism, unspecified: Secondary | ICD-10-CM | POA: Diagnosis not present

## 2022-07-23 NOTE — Progress Notes (Signed)
Subjective:  Patient ID: Meghan Ortiz, female    DOB: 11/16/1940  Age: 81 y.o. MRN: [id number]  CC: Chief Complaint  Patient presents with   Hypertension    Follow up No problems or concerns    HPI:  81 year old female with hypertension, hypothyroidism, osteoporosis, prediabetes, hyperlipidemia presents for follow-up.  Patient states that she is doing well.  Has no particular complaints today.  Hypertension is well-controlled on enalapril.  Patient is due for pneumonia vaccine and influenza vaccine.  She is okay with getting these today.  Hyperlipidemia has been stable on Crestor.  Synthroid has been stable.  Currently on levothyroxine 75 mcg.  Compliant.  Patient Active Problem List   Diagnosis Date Noted   Healthcare maintenance 07/23/2022   Osteoporosis 01/16/2022   Prediabetes 03/20/2018   Essential hypertension 01/22/2018   Hyperlipidemia 01/22/2018   Hypothyroidism 01/22/2018    Social Hx   Social History   Socioeconomic History   Marital status: Widowed    Spouse name: Not on file   Number of children: Not on file   Years of education: Not on file   Highest education level: Not on file  Occupational History   Not on file  Tobacco Use   Smoking status: Never   Smokeless tobacco: Never  Vaping Use   Vaping Use: Never used  Substance and Sexual Activity   Alcohol use: Never   Drug use: Never   Sexual activity: Not Currently    Birth control/protection: Post-menopausal  Other Topics Concern   Not on file  Social History Narrative   Not on file   Social Determinants of Health   Financial Resource Strain: Low Risk  (06/12/2022)   Overall Financial Resource Strain (CARDIA)    Difficulty of Paying Living Expenses: Not hard at all  Food Insecurity: No Food Insecurity (06/12/2022)   Hunger Vital Sign    Worried About Running Out of Food in the Last Year: Never true    [place] in the Last Year: Never true  Transportation Needs: No  Transportation Needs (06/12/2022)   PRAPARE - Hydrologist (Medical): No    Lack of Transportation (Non-Medical): No  Physical Activity: Sufficiently Active (06/12/2022)   Exercise Vital Sign    Days of Exercise per Week: 3 days    Minutes of Exercise per Session: 60 min  Stress: No Stress Concern Present (06/12/2022)   [place]    Feeling of Stress : Not at all  Social Connections: Moderately Isolated (06/12/2022)   Social Connection and Isolation Panel [NHANES]    Frequency of Communication with Friends and Family: More than three times a week    Frequency of Social Gatherings with Friends and Family: More than three times a week    Attends Religious Services: More than 4 times per year    Active Member of [employer] or Organizations: No    Attends Archivist Meetings: Never    Marital Status: Widowed    Review of Systems  Constitutional: Negative.   Respiratory: Negative.    Cardiovascular: Negative.    Objective:  BP 132/82   Ht 5' 3.5" (1.613 m)   Wt 165 lb 12.8 oz (75.2 kg)   BMI 28.91 kg/m      07/23/2022    9:20 AM 06/12/2022    8:54 AM 01/16/2022    9:52 AM  BP/Weight  Systolic BP 330  076  Diastolic  BP 82  75  Wt. (Lbs) 165.8 164 164.4  BMI 28.91 kg/m2 28.6 kg/m2 28.67 kg/m2    Physical Exam Vitals and nursing note reviewed.  Constitutional:      General: She is not in acute distress.    Appearance: Normal appearance.  HENT:     Head: Normocephalic and atraumatic.  Eyes:     General:        Right eye: No discharge.        Left eye: No discharge.     Conjunctiva/sclera: Conjunctivae normal.  Cardiovascular:     Rate and Rhythm: Normal rate and regular rhythm.  Pulmonary:     Effort: Pulmonary effort is normal.     Breath sounds: Normal breath sounds. No wheezing, rhonchi or rales.  Neurological:     Mental Status: She is alert.  Psychiatric:         Mood and Affect: Mood normal.        Behavior: Behavior normal.     Lab Results  Component Value Date   WBC 7.3 01/16/2022   HGB 13.4 01/16/2022   HCT 39.4 01/16/2022   PLT 374 01/16/2022   GLUCOSE 91 01/16/2022   CHOL 167 01/16/2022   TRIG 96 01/16/2022   HDL 54 01/16/2022   LDLCALC 95 01/16/2022   ALT 12 01/16/2022   AST 13 01/16/2022   NA 138 01/16/2022   K 5.0 01/16/2022   CL 101 01/16/2022   CREATININE 0.81 01/16/2022   BUN 12 01/16/2022   CO2 24 01/16/2022   TSH 1.040 01/16/2022   HGBA1C 5.7 (H) 01/16/2022     Assessment & Plan:   Problem List Items Addressed This Visit       Cardiovascular and Mediastinum   Essential hypertension - Primary    Well-controlled.  Continue enalapril.        Endocrine   Hypothyroidism    Stable.  Continue current dosing of Synthroid.  Labs at follow-up appointment in 6 months.        Other   Healthcare maintenance    Influenza and pneumococcal vaccines given today.      Hyperlipidemia    Stable.  Continue Crestor.      Other Visit Diagnoses     Need for vaccination       Relevant Orders   Flu Vaccine QUAD High Dose(Fluad) (Completed)   Pneumococcal conjugate vaccine 20-valent (Completed)      Follow-up:  Return in about 6 months (around 01/21/2023).  [place]

## 2022-07-23 NOTE — Assessment & Plan Note (Signed)
Stable. Continue Crestor. 

## 2022-07-23 NOTE — Assessment & Plan Note (Signed)
Stable.  Continue current dosing of Synthroid.  Labs at follow-up appointment in 6 months.

## 2022-07-23 NOTE — Assessment & Plan Note (Signed)
Well controlled. Continue enalapril.

## 2022-07-23 NOTE — Assessment & Plan Note (Signed)
Influenza and pneumococcal vaccines given today.

## 2022-07-23 NOTE — Patient Instructions (Signed)
Continue your meds.  Follow up in 6 months.  Take care  Dr. Ardena Gangl  

## 2022-09-03 ENCOUNTER — Other Ambulatory Visit: Payer: Self-pay | Admitting: Family Medicine

## 2022-09-11 ENCOUNTER — Other Ambulatory Visit: Payer: Self-pay | Admitting: Family Medicine

## 2022-09-12 DIAGNOSIS — Z85828 Personal history of other malignant neoplasm of skin: Secondary | ICD-10-CM | POA: Diagnosis not present

## 2022-09-12 DIAGNOSIS — Z08 Encounter for follow-up examination after completed treatment for malignant neoplasm: Secondary | ICD-10-CM | POA: Diagnosis not present

## 2022-09-28 ENCOUNTER — Other Ambulatory Visit: Payer: Self-pay | Admitting: Family Medicine

## 2022-12-31 ENCOUNTER — Other Ambulatory Visit: Payer: Self-pay | Admitting: Family Medicine

## 2023-01-22 ENCOUNTER — Ambulatory Visit: Payer: Medicare Other | Admitting: Family Medicine

## 2023-01-31 ENCOUNTER — Encounter (INDEPENDENT_AMBULATORY_CARE_PROVIDER_SITE_OTHER): Payer: Medicare Other | Admitting: Ophthalmology

## 2023-01-31 DIAGNOSIS — H43813 Vitreous degeneration, bilateral: Secondary | ICD-10-CM

## 2023-01-31 DIAGNOSIS — H2513 Age-related nuclear cataract, bilateral: Secondary | ICD-10-CM | POA: Diagnosis not present

## 2023-01-31 DIAGNOSIS — D3131 Benign neoplasm of right choroid: Secondary | ICD-10-CM

## 2023-02-18 ENCOUNTER — Ambulatory Visit (INDEPENDENT_AMBULATORY_CARE_PROVIDER_SITE_OTHER): Payer: Medicare Other | Admitting: Family Medicine

## 2023-02-18 VITALS — BP 124/65 | HR 68 | Temp 97.3°F | Ht 63.5 in | Wt 160.6 lb

## 2023-02-18 DIAGNOSIS — M810 Age-related osteoporosis without current pathological fracture: Secondary | ICD-10-CM | POA: Diagnosis not present

## 2023-02-18 DIAGNOSIS — E039 Hypothyroidism, unspecified: Secondary | ICD-10-CM

## 2023-02-18 DIAGNOSIS — R7303 Prediabetes: Secondary | ICD-10-CM

## 2023-02-18 DIAGNOSIS — E785 Hyperlipidemia, unspecified: Secondary | ICD-10-CM | POA: Diagnosis not present

## 2023-02-18 DIAGNOSIS — I10 Essential (primary) hypertension: Secondary | ICD-10-CM

## 2023-02-18 MED ORDER — ENALAPRIL MALEATE 5 MG PO TABS: 5.0000 mg | ORAL_TABLET | Freq: Every day | ORAL | 3 refills | Status: DC

## 2023-02-18 MED ORDER — LEVOTHYROXINE SODIUM 75 MCG PO TABS: ORAL_TABLET | ORAL | 1 refills | Status: DC

## 2023-02-18 NOTE — Assessment & Plan Note (Signed)
Stable on enalapril.  Continue.  Labs today.

## 2023-02-18 NOTE — Progress Notes (Signed)
Subjective:  Patient ID: Meghan Ortiz, female    DOB: [birth year]  Age: 82 y.o. MRN: [id number]  CC: Chief Complaint  Patient presents with   Follow-up    HPI:  82 year old female with hypertension, hypothyroidism, osteoporosis, prediabetes, hyperlipidemia presents for follow-up  Patient states that overall she is doing well.  She has had a recent fall and had some dental trauma.  She is awaiting a partial.  Hypertension is well-controlled on enalapril.  Remains on Boniva for osteoporosis.  Hypothyroidism has been stable on levothyroxine 75 mcg daily.  Needs labs.  According to the EMR, patient has not been taking Crestor.  Needs lipid panel.  Patient is overall feeling well.  No chest pain or shortness of breath.  Patient Active Problem List   Diagnosis Date Noted   Healthcare maintenance 07/23/2022   Osteoporosis 01/16/2022   Prediabetes 03/20/2018   Essential hypertension 01/22/2018   Hyperlipidemia 01/22/2018   Hypothyroidism 01/22/2018    Social Hx   Social History   Socioeconomic History   Marital status: Widowed    Spouse name: Not on file   Number of children: Not on file   Years of education: Not on file   Highest education level: Not on file  Occupational History   Not on file  Tobacco Use   Smoking status: Never   Smokeless tobacco: Never  Vaping Use   Vaping Use: Never used  Substance and Sexual Activity   Alcohol use: Never   Drug use: Never   Sexual activity: Not Currently    Birth control/protection: Post-menopausal  Other Topics Concern   Not on file  Social History Narrative   Not on file   Social Determinants of Health   Financial Resource Strain: Low Risk  (06/12/2022)   Overall Financial Resource Strain (CARDIA)    Difficulty of Paying Living Expenses: Not hard at all  Food Insecurity: No Food Insecurity (06/12/2022)   Hunger Vital Sign    Worried About Running Out of Food in the Last Year: Never true    Ran Out of Food in  the Last Year: Never true  Transportation Needs: No Transportation Needs (06/12/2022)   PRAPARE - Administrator, Civil Service (Medical): No    Lack of Transportation (Non-Medical): No  Physical Activity: Sufficiently Active (06/12/2022)   Exercise Vital Sign    Days of Exercise per Week: 3 days    Minutes of Exercise per Session: 60 min  Stress: No Stress Concern Present (06/12/2022)   [employer] of Occupational Health - Occupational Stress Questionnaire    Feeling of Stress : Not at all  Social Connections: Moderately Isolated (06/12/2022)   Social Connection and Isolation Panel [NHANES]    Frequency of Communication with Friends and Family: More than three times a week    Frequency of Social Gatherings with Friends and Family: More than three times a week    Attends Religious Services: More than 4 times per year    Active Member of [employer] or Organizations: No    Attends Banker Meetings: Never    Marital Status: Widowed    Review of Systems Per HPI  Objective:  BP 124/65   Pulse 68   Temp (!) 97.3 F (36.3 C)   Ht 5' 3.5" (1.613 m)   Wt 160 lb 9.6 oz (72.8 kg)   SpO2 98%   BMI 28.00 kg/m      02/18/2023    9:23 AM 07/23/2022  9:20 AM 06/12/2022    8:54 AM  BP/Weight  Systolic BP 124 132   Diastolic BP 65 82   Wt. (Lbs) 160.6 165.8 164  BMI 28 kg/m2 28.91 kg/m2 28.6 kg/m2    Physical Exam Vitals and nursing note reviewed.  Constitutional:      General: She is not in acute distress.    Appearance: Normal appearance.  HENT:     Head: Normocephalic and atraumatic.  Eyes:     General:        Right eye: No discharge.        Left eye: No discharge.     Conjunctiva/sclera: Conjunctivae normal.  Cardiovascular:     Rate and Rhythm: Normal rate and regular rhythm.  Pulmonary:     Effort: Pulmonary effort is normal.     Breath sounds: Normal breath sounds. No wheezing, rhonchi or rales.  Neurological:     Mental Status: She is  alert.  Psychiatric:        Mood and Affect: Mood normal.        Behavior: Behavior normal.     Lab Results  Component Value Date   WBC 7.3 01/16/2022   HGB 13.4 01/16/2022   HCT 39.4 01/16/2022   PLT 374 01/16/2022   GLUCOSE 91 01/16/2022   CHOL 167 01/16/2022   TRIG 96 01/16/2022   HDL 54 01/16/2022   LDLCALC 95 01/16/2022   ALT 12 01/16/2022   AST 13 01/16/2022   NA 138 01/16/2022   K 5.0 01/16/2022   CL 101 01/16/2022   CREATININE 0.81 01/16/2022   BUN 12 01/16/2022   CO2 24 01/16/2022   TSH 1.040 01/16/2022   HGBA1C 5.7 (H) 01/16/2022     Assessment & Plan:   Problem List Items Addressed This Visit       Cardiovascular and Mediastinum   Essential hypertension - Primary    Stable on enalapril.  Continue.  Labs today.      Relevant Medications   enalapril (VASOTEC) 5 MG tablet   Other Relevant Orders   CMP14+EGFR   Microalbumin / creatinine urine ratio     Endocrine   Hypothyroidism    TSH today to assess.  Continue current dosing of Synthroid.  Will dose adjust if need be based on TSH.      Relevant Medications   levothyroxine (SYNTHROID) 75 MCG tablet   Other Relevant Orders   TSH     Musculoskeletal and Integument   Osteoporosis    Continue Boniva.  Followed by OB/GYN.        Other   Prediabetes   Relevant Orders   Hemoglobin A1c   Hyperlipidemia    Lipid panel today to assess.      Relevant Medications   enalapril (VASOTEC) 5 MG tablet   Other Relevant Orders   Lipid panel    Meds ordered this encounter  Medications   enalapril (VASOTEC) 5 MG tablet    Sig: Take 1 tablet (5 mg total) by mouth daily.    Dispense:  90 tablet    Refill:  3   levothyroxine (SYNTHROID) 75 MCG tablet    Sig: TAKE ONE TABLET ( ) BY MOUTH DAILY MONDAY THROUGH SATURDAY IN THE MORNING ON AN EMPTY STOMACH WITH WATER. NO DOSE ON SUNDAYS.    Dispense:  90 tablet    Refill:  1    Follow-up:  Return in about 6 months (around 08/20/2023).  Everlene Other DO [facility]

## 2023-02-18 NOTE — Assessment & Plan Note (Signed)
TSH today to assess.  Continue current dosing of Synthroid.  Will dose adjust if need be based on TSH.

## 2023-02-18 NOTE — Assessment & Plan Note (Signed)
Continue Boniva.  Followed by OB/GYN.

## 2023-02-18 NOTE — Assessment & Plan Note (Signed)
Lipid panel today to assess. 

## 2023-02-18 NOTE — Patient Instructions (Signed)
Labs today.  Continue your medication.  Follow up in 6 months.  

## 2023-02-19 LAB — CMP14+EGFR
Albumin: 3.9 g/dL (ref 3.8–4.8)
Alkaline Phosphatase: 85 IU/L (ref 44–121)
BUN/Creatinine Ratio: 12 (ref 12–28)
BUN: 9 mg/dL (ref 8–27)
Chloride: 98 mmol/L (ref 96–106)
Globulin, Total: 2.5 g/dL (ref 1.5–4.5)
Potassium: 4.3 mmol/L (ref 3.5–5.2)
Total Protein: 6.4 g/dL (ref 6.0–8.5)

## 2023-02-19 LAB — LIPID PANEL
Triglycerides: 179 mg/dL — ABNORMAL HIGH (ref 0–149)
VLDL Cholesterol Cal: 31 mg/dL (ref 5–40)

## 2023-02-19 LAB — HEMOGLOBIN A1C: Hgb A1c MFr Bld: 5.9 % — ABNORMAL HIGH (ref 4.8–5.6)

## 2023-02-21 LAB — MICROALBUMIN / CREATININE URINE RATIO
Creatinine, Urine: 60 mg/dL
Microalb/Creat Ratio: 5 mg/g creat (ref 0–29)
Microalbumin, Urine: 3.2 ug/mL

## 2023-02-21 LAB — CMP14+EGFR
ALT: 17 IU/L (ref 0–32)
AST: 20 IU/L (ref 0–40)
Bilirubin Total: <0.2 mg/dL (ref 0.0–1.2)
CO2: 23 mmol/L (ref 20–29)
Calcium: 9.2 mg/dL (ref 8.7–10.3)
Creatinine, Ser: 0.77 mg/dL (ref 0.57–1.00)
Glucose: 95 mg/dL (ref 70–99)
Sodium: 136 mmol/L (ref 134–144)
eGFR: 78 mL/min/{1.73_m2} (ref >59)

## 2023-02-21 LAB — LIPID PANEL
Chol/HDL Ratio: 3.8 ratio (ref 0.0–4.4)
Cholesterol, Total: 172 mg/dL (ref 100–199)
HDL: 45 mg/dL (ref >39)
LDL Chol Calc (NIH): 96 mg/dL (ref 0–99)

## 2023-02-21 LAB — TSH: TSH: 1.27 u[IU]/mL (ref 0.450–4.500)

## 2023-02-21 LAB — HEMOGLOBIN A1C: Est. average glucose Bld gHb Est-mCnc: 123 mg/dL

## 2023-03-13 DIAGNOSIS — Z08 Encounter for follow-up examination after completed treatment for malignant neoplasm: Secondary | ICD-10-CM | POA: Diagnosis not present

## 2023-03-13 DIAGNOSIS — D225 Melanocytic nevi of trunk: Secondary | ICD-10-CM | POA: Diagnosis not present

## 2023-03-13 DIAGNOSIS — Z85828 Personal history of other malignant neoplasm of skin: Secondary | ICD-10-CM | POA: Diagnosis not present

## 2023-03-13 DIAGNOSIS — Z1283 Encounter for screening for malignant neoplasm of skin: Secondary | ICD-10-CM | POA: Diagnosis not present

## 2023-06-13 ENCOUNTER — Other Ambulatory Visit (HOSPITAL_COMMUNITY): Payer: Self-pay | Admitting: Obstetrics and Gynecology

## 2023-06-13 DIAGNOSIS — Z1231 Encounter for screening mammogram for malignant neoplasm of breast: Secondary | ICD-10-CM

## 2023-06-18 DIAGNOSIS — D3131 Benign neoplasm of right choroid: Secondary | ICD-10-CM | POA: Diagnosis not present

## 2023-06-28 ENCOUNTER — Ambulatory Visit (INDEPENDENT_AMBULATORY_CARE_PROVIDER_SITE_OTHER): Payer: Medicare Other

## 2023-06-28 VITALS — Ht 63.5 in | Wt 160.0 lb

## 2023-06-28 DIAGNOSIS — Z0000 Encounter for general adult medical examination without abnormal findings: Secondary | ICD-10-CM | POA: Diagnosis not present

## 2023-06-28 NOTE — Patient Instructions (Signed)
Meghan Ortiz , Thank you for taking time to come for your Medicare Wellness Visit. I appreciate your ongoing commitment to your health goals. Please review the following plan we discussed and let me know if I can assist you in the future.   Referrals/Orders/Follow-Ups/Clinician Recommendations: Aim for 30 minutes of exercise or brisk walking, 6-8 glasses of water, and 5 servings of fruits and vegetables each day.  This is a list of the screening recommended for you and due dates:  Health Maintenance  Topic Date Due   DTaP/Tdap/Td vaccine (1 - Tdap) Never done   Flu Shot  03/28/2023   Medicare Annual Wellness Visit  06/13/2023   Pneumonia Vaccine  Completed   DEXA scan (bone density measurement)  Completed   HPV Vaccine  Aged Out   Colon Cancer Screening  Discontinued   COVID-19 Vaccine  Discontinued   Zoster (Shingles) Vaccine  Discontinued    Advanced directives: (ACP Link)Information on Advanced Care Planning can be found at [facility] [place] Advance Health Care Directives Advance Health Care Directives ([email or web address]   Next Medicare Annual Wellness Visit scheduled for next year: Yes

## 2023-06-28 NOTE — Progress Notes (Signed)
Subjective:   Meghan Ortiz is a 82 y.o. female who presents for Medicare Annual (Subsequent) preventive examination.  Visit Complete: Virtual I connected with  Lodema Pilot on 06/28/23 by a audio enabled telemedicine application and verified that I am speaking with the correct person using two identifiers.  Patient Location: Home  Provider Location: Home Office  I discussed the limitations of evaluation and management by telemedicine. The patient expressed understanding and agreed to proceed.  Vital Signs: Because this visit was a virtual/telehealth visit, some criteria may be missing or patient reported. Any vitals not documented were not able to be obtained and vitals that have been documented are patient reported.  Cardiac Risk Factors include: advanced age (>15men, >14 women);hypertension;dyslipidemia     Objective:    Today's Vitals   06/28/23 0935  Weight: 160 lb (72.6 kg)  Height: 5' 3.5" (1.613 m)   Body mass index is 27.9 kg/m.     06/28/2023    9:40 AM 06/12/2022    8:47 AM  Advanced Directives  Does Patient Have a Medical Advance Directive? No No  Would patient like information on creating a medical advance directive? Yes (MAU/Ambulatory/Procedural Areas - Information given) No - Patient declined    Current Medications (verified) Outpatient Encounter Medications as of 06/28/2023  Medication Sig   calcium carbonate (OSCAL) 1500 (600 Ca) MG TABS tablet Take by mouth.   Cholecalciferol 50 MCG (2000 UT) TABS Take by mouth.   enalapril (VASOTEC) 5 MG tablet Take 1 tablet (5 mg total) by mouth daily.   ibandronate (BONIVA) 150 MG tablet Take 150 mg by mouth every 30 (thirty) days.   lansoprazole (PREVACID) 15 MG capsule Take by mouth.   levothyroxine (SYNTHROID) 75 MCG tablet TAKE ONE TABLET ( ) BY MOUTH DAILY MONDAY THROUGH SATURDAY IN THE MORNING ON AN EMPTY STOMACH WITH WATER. NO DOSE ON SUNDAYS.   oxybutynin (DITROPAN-XL) 10 MG 24 hr tablet Take 10  mg by mouth at bedtime.   rosuvastatin (CRESTOR) 5 MG tablet TAKE ONE TABLET (5MG  TOTAL) BY MOUTH MONDAYS, WEDNESDAYS, AND FRIDAYS FOR CHOLESTEROL   venlafaxine XR (EFFEXOR-XR) 75 MG 24 hr capsule Take by mouth.   vitamin B-12 (CYANOCOBALAMIN) 500 MCG tablet Take 500 mcg by mouth daily.   VITAMIN E PO Take by mouth.   No facility-administered encounter medications on file as of 06/28/2023.    Allergies (verified) Patient has no known allergies.   History: No past medical history on file. No past surgical history on file. No family history on file. Social History   Socioeconomic History   Marital status: Widowed    Spouse name: Not on file   Number of children: Not on file   Years of education: Not on file   Highest education level: Not on file  Occupational History   Not on file  Tobacco Use   Smoking status: Never   Smokeless tobacco: Never  Vaping Use   Vaping status: Never Used  Substance and Sexual Activity   Alcohol use: Never   Drug use: Never   Sexual activity: Not Currently    Birth control/protection: Post-menopausal  Other Topics Concern   Not on file  Social History Narrative   Not on file   Social Determinants of Health   Financial Resource Strain: Low Risk  (06/28/2023)   Overall Financial Resource Strain (CARDIA)    Difficulty of Paying Living Expenses: Not hard at all  Food Insecurity: No Food Insecurity (06/28/2023)   Hunger Vital Sign  Worried About Programme researcher, broadcasting/film/video in the Last Year: Never true    Ran Out of Food in the Last Year: Never true  Transportation Needs: No Transportation Needs (06/28/2023)   PRAPARE - Administrator, Civil Service (Medical): No    Lack of Transportation (Non-Medical): No  Physical Activity: Insufficiently Active (06/28/2023)   Exercise Vital Sign    Days of Exercise per Week: 3 days    Minutes of Exercise per Session: 30 min  Stress: No Stress Concern Present (06/28/2023)   [employer] of  Occupational Health - Occupational Stress Questionnaire    Feeling of Stress : Not at all  Social Connections: Moderately Isolated (06/28/2023)   Social Connection and Isolation Panel [NHANES]    Frequency of Communication with Friends and Family: More than three times a week    Frequency of Social Gatherings with Friends and Family: Three times a week    Attends Religious Services: More than 4 times per year    Active Member of Clubs or Organizations: No    Attends Banker Meetings: Never    Marital Status: Widowed    Tobacco Counseling Counseling given: Not Answered   Clinical Intake:  Pre-visit preparation completed: Yes  Pain : No/denies pain     Diabetes: No  How often do you need to have someone help you when you read instructions, pamphlets, or other written materials from your doctor or pharmacy?: 1 - Never  Interpreter Needed?: No  Information entered by :: Kandis Fantasia LPN   Activities of Daily Living    06/28/2023    9:40 AM  In your present state of health, do you have any difficulty performing the following activities:  Hearing? 0  Vision? 0  Difficulty concentrating or making decisions? 0  Walking or climbing stairs? 0  Dressing or bathing? 0  Doing errands, shopping? 0  Preparing Food and eating ? N  Using the Toilet? N  In the past six months, have you accidently leaked urine? N  Do you have problems with loss of bowel control? N  Managing your Medications? N  Managing your Finances? N  Housekeeping or managing your Housekeeping? N    Patient Care Team: Tommie Sams, DO as PCP - General (Family Medicine) Nita Sells, MD (Dermatology) Richardean Chimera, MD as Consulting Physician (Obstetrics and Gynecology) Sherrie George, MD as Consulting Physician (Ophthalmology)  Indicate any recent Medical Services you may have received from other than [facility] providers in the past year (date may be approximate).     Assessment:   This is a  routine wellness examination for [place].  Hearing/Vision screen Hearing Screening - Comments:: Denies hearing difficulties   Vision Screening - Comments:: Wears rx glasses - up to date with routine eye exams with Dr. Ashley Royalty      Goals Addressed             This Visit's Progress    Remain active and independent        Depression Screen    06/28/2023    9:38 AM 02/18/2023    9:27 AM 07/23/2022    9:22 AM 06/12/2022    8:46 AM 07/18/2021   10:49 AM 04/15/2020   10:19 AM 03/20/2018    9:00 AM  PHQ 2/9 Scores  PHQ - 2 Score 0 0 0 0 0 0 0  PHQ- 9 Score  0 0 0   0    Fall Risk    06/28/2023  9:40 AM 02/18/2023    9:26 AM 07/23/2022    9:22 AM 06/12/2022    8:47 AM 07/18/2021   10:48 AM  Fall Risk   Falls in the past year? 0 1 0 0 0  Number falls in past yr: 0 0  0 0  Injury with Fall? 0 1  0 0  Risk for fall due to : No Fall Risks  No Fall Risks No Fall Risks No Fall Risks  Follow up Falls prevention discussed;Education provided;Falls evaluation completed  Falls evaluation completed Falls prevention discussed;Falls evaluation completed Falls evaluation completed    MEDICARE RISK AT HOME: Medicare Risk at Home Any stairs in or around the home?: No If so, are there any without handrails?: No Home free of loose throw rugs in walkways, pet beds, electrical cords, etc?: Yes Adequate lighting in your home to reduce risk of falls?: Yes Life alert?: No Use of a cane, walker or w/c?: No Grab bars in the bathroom?: Yes Shower chair or bench in shower?: No Elevated toilet seat or a handicapped toilet?: Yes  TIMED UP AND GO:  Was the test performed?  No    Cognitive Function:        06/28/2023    9:40 AM 06/12/2022    8:48 AM  6CIT Screen  What Year? 0 points 0 points  What month? 0 points 0 points  What time? 0 points 0 points  Count back from 20 0 points 0 points  Months in reverse 0 points 0 points  Repeat phrase 0 points 0 points  Total Score 0 points 0  points    Immunizations Immunization History  Administered Date(s) Administered   Fluad Quad(high Dose 65+) 07/18/2021, 07/23/2022   Influenza Split 05/28/2016   Influenza,inj,Quad PF,6+ Mos 06/10/2019, 06/05/2020   Influenza,trivalent, recombinat, inj, PF 06/08/2015, 05/30/2017   Influenza-Unspecified 06/05/2018   Moderna Sars-Covid-2 Vaccination 09/11/2019, 10/12/2019, 05/12/2020   PNEUMOCOCCAL CONJUGATE-20 07/23/2022    TDAP status: Due, Education has been provided regarding the importance of this vaccine. Advised may receive this vaccine at local pharmacy or Health Dept. Aware to provide a copy of the vaccination record if obtained from local pharmacy or Health Dept. Verbalized acceptance and understanding.  Flu Vaccine status: Due, Education has been provided regarding the importance of this vaccine. Advised may receive this vaccine at local pharmacy or Health Dept. Aware to provide a copy of the vaccination record if obtained from local pharmacy or Health Dept. Verbalized acceptance and understanding.  Pneumococcal vaccine status: Up to date  Covid-19 vaccine status: Information provided on how to obtain vaccines.   Qualifies for Shingles Vaccine? No   Zostavax completed No   Shingrix Completed?: No.    Education has been provided regarding the importance of this vaccine. Patient has been advised to call insurance company to determine out of pocket expense if they have not yet received this vaccine. Advised may also receive vaccine at local pharmacy or Health Dept. Verbalized acceptance and understanding.  Screening Tests Health Maintenance  Topic Date Due   DTaP/Tdap/Td (1 - Tdap) Never done   INFLUENZA VACCINE  03/28/2023   Medicare Annual Wellness (AWV)  06/27/2024   Pneumonia Vaccine 28+ Years old  Completed   DEXA SCAN  Completed   HPV VACCINES  Aged Out   Colonoscopy  Discontinued   COVID-19 Vaccine  Discontinued   Zoster Vaccines- Shingrix  Discontinued    Health  Maintenance  Health Maintenance Due  Topic Date Due   DTaP/Tdap/Td (  1 - Tdap) Never done   INFLUENZA VACCINE  03/28/2023    Colorectal cancer screening: No longer required.   Mammogram status: Ordered and scheduled for 07/15/23. Pt provided with contact info and advised to call to schedule appt.   Bone Density status:  completed at GYN; will request records   Lung Cancer Screening: (Low Dose CT Chest recommended if Age 63-80 years, 20 pack-year currently smoking OR have quit w/in 15years.) does not qualify.   Lung Cancer Screening Referral: n/a  Additional Screening:  Hepatitis C Screening: does not qualify  Vision Screening: Recommended annual ophthalmology exams for early detection of glaucoma and other disorders of the eye. Is the patient up to date with their annual eye exam?  Yes  Who is the provider or what is the name of the office in which the patient attends annual eye exams? Dr. Alan Mulder  If pt is not established with a provider, would they like to be referred to a provider to establish care? No .   Dental Screening: Recommended annual dental exams for proper oral hygiene  Community Resource Referral / Chronic Care Management: CRR required this visit?  No   CCM required this visit?  No     Plan:     I have personally reviewed and noted the following in the patient's chart:   Medical and social history Use of alcohol, tobacco or illicit drugs  Current medications and supplements including opioid prescriptions. Patient is not currently taking opioid prescriptions. Functional ability and status Nutritional status Physical activity Advanced directives List of other physicians Hospitalizations, surgeries, and ER visits in previous 12 months Vitals Screenings to include cognitive, depression, and falls Referrals and appointments  In addition, I have reviewed and discussed with patient certain preventive protocols, quality metrics, and best practice  recommendations. A written personalized care plan for preventive services as well as general preventive health recommendations were provided to patient.     Kandis Fantasia [place], [place]   96/09/9526   After Visit Summary: (MyChart) Due to this being a telephonic visit, the after visit summary with patients personalized plan was offered to patient via MyChart   Nurse Notes: No concerns at this time

## 2023-07-15 ENCOUNTER — Ambulatory Visit (HOSPITAL_COMMUNITY)
Admission: RE | Admit: 2023-07-15 | Discharge: 2023-07-15 | Disposition: A | Discharge status: Home / Self Care | Payer: Medicare Other | Source: Ambulatory Visit | Attending: Obstetrics and Gynecology | Admitting: Obstetrics and Gynecology

## 2023-07-15 DIAGNOSIS — Z1231 Encounter for screening mammogram for malignant neoplasm of breast: Secondary | ICD-10-CM | POA: Diagnosis not present

## 2023-07-16 DIAGNOSIS — Z6829 Body mass index (BMI) 29.0-29.9, adult: Secondary | ICD-10-CM | POA: Diagnosis not present

## 2023-07-16 DIAGNOSIS — Z01419 Encounter for gynecological examination (general) (routine) without abnormal findings: Secondary | ICD-10-CM | POA: Diagnosis not present

## 2023-07-16 DIAGNOSIS — N959 Unspecified menopausal and perimenopausal disorder: Secondary | ICD-10-CM | POA: Diagnosis not present

## 2023-08-26 ENCOUNTER — Ambulatory Visit: Payer: Medicare Other | Admitting: Family Medicine

## 2023-08-26 ENCOUNTER — Encounter: Payer: Self-pay | Admitting: Physician Assistant

## 2023-08-26 ENCOUNTER — Ambulatory Visit (INDEPENDENT_AMBULATORY_CARE_PROVIDER_SITE_OTHER): Payer: Medicare Other | Admitting: Physician Assistant

## 2023-08-26 VITALS — BP 134/73 | HR 64 | Temp 98.1°F | Ht 63.5 in | Wt 159.0 lb

## 2023-08-26 DIAGNOSIS — R7303 Prediabetes: Secondary | ICD-10-CM | POA: Diagnosis not present

## 2023-08-26 DIAGNOSIS — Z23 Encounter for immunization: Secondary | ICD-10-CM | POA: Diagnosis not present

## 2023-08-26 DIAGNOSIS — I10 Essential (primary) hypertension: Secondary | ICD-10-CM | POA: Diagnosis not present

## 2023-08-26 DIAGNOSIS — E785 Hyperlipidemia, unspecified: Secondary | ICD-10-CM

## 2023-08-26 DIAGNOSIS — M810 Age-related osteoporosis without current pathological fracture: Secondary | ICD-10-CM | POA: Diagnosis not present

## 2023-08-26 DIAGNOSIS — E039 Hypothyroidism, unspecified: Secondary | ICD-10-CM

## 2023-08-26 NOTE — Assessment & Plan Note (Signed)
Boniva managed by patient's OBGYN provider. Continue with current treatment plan.

## 2023-08-26 NOTE — Assessment & Plan Note (Signed)
Patient denies concerns today. Stable. Continue with current treatment plan.

## 2023-08-26 NOTE — Progress Notes (Signed)
Established Patient Office Visit  Subjective   Patient ID: Meghan Ortiz, female    DOB: 1941/04/18  Age: 82 y.o. MRN: [id number]  Chief Complaint  Patient presents with   Hypertension    6 month follow up    Patient presents today for chronic medical follow-up for hypertension, hyper lipidemia, osteoporosis, hypothyroidism, and prediabetes.  She states no concerns or complaints at this time.  She reports daily compliance with medications.  Health maintenance and preventative care up-to-date at this time.    Review of Systems  Constitutional:  Negative for chills, fever and weight loss.  HENT: Negative.    Eyes: Negative.   Respiratory:  Negative for cough, shortness of breath and wheezing.   Cardiovascular:  Negative for chest pain, palpitations and leg swelling.  Gastrointestinal:  Negative for constipation, diarrhea, nausea and vomiting.  Genitourinary: Negative.   Musculoskeletal: Negative.       Objective:     BP (!) 149/7   Pulse 64   Temp 98.1 F (36.7 C)   Ht 5' 3.5" (1.613 m)   Wt 159 lb (72.1 kg)   SpO2 95%   BMI 27.72 kg/m    Physical Exam Constitutional:      General: She is not in acute distress.    Appearance: Normal appearance. She is normal weight.  HENT:     Head: Normocephalic.     Mouth/Throat:     Mouth: Mucous membranes are moist.     Pharynx: Oropharynx is clear.  Eyes:     Extraocular Movements: Extraocular movements intact.     Conjunctiva/sclera: Conjunctivae normal.  Cardiovascular:     Rate and Rhythm: Normal rate and regular rhythm.     Heart sounds: No murmur heard.    No gallop.  Pulmonary:     Effort: Pulmonary effort is normal.     Breath sounds: No wheezing, rhonchi or rales.  Musculoskeletal:     Right lower leg: No edema.     Left lower leg: No edema.  Skin:    General: Skin is warm and dry.  Neurological:     General: No focal deficit present.     Mental Status: She is alert and oriented to person, place, and  time.  Psychiatric:        Mood and Affect: Mood normal.        Behavior: Behavior normal.     The ASCVD Risk score (Arnett DK, et al., 2019) failed to calculate for the following reasons:   The 2019 ASCVD risk score is only valid for ages 91 to 14    Assessment & Plan:   Return in about 6 months (around 02/24/2024) for medical follow up.   Essential hypertension Assessment & Plan: Blood pressure slighly elevated today. Patient reports busy morning and concerns for being late to this appointment. Will follow up at next scheduled visit.   Orders: -     CMP14+EGFR  Hypothyroidism, unspecified type Assessment & Plan: Patient denies concerns today. Stable at this time. TSH and T4 drawn today, will adjust medication as needed. Continue with current treatment plan.   Orders: -     TSH + free T4  Osteoporosis, unspecified osteoporosis type, unspecified pathological fracture presence Assessment & Plan: Boniva managed by patient's OBGYN provider. Continue with current treatment plan.    Prediabetes Assessment & Plan: Patient with no concerns today. Patient reports walking 3 times per week. Continue with healthy lifestyle. A1c today.  Orders: -  Hemoglobin A1c  Hyperlipidemia, unspecified hyperlipidemia type Assessment & Plan: Patient denies concerns today. Stable. Continue with current treatment plan.    Influenza vaccine needed     Patient appears well today.  She does not have any concerns or complaints today.  Influenza vaccine given today.  Patient follow-up in 6 months or sooner if needed.   Toni Amend [place], PA-C

## 2023-08-26 NOTE — Assessment & Plan Note (Signed)
Patient with no concerns today. Patient reports walking 3 times per week. Continue with healthy lifestyle. A1c today.

## 2023-08-26 NOTE — Assessment & Plan Note (Signed)
Blood pressure slighly elevated today. Patient reports busy morning and concerns for being late to this appointment. Will follow up at next scheduled visit.

## 2023-08-26 NOTE — Assessment & Plan Note (Signed)
Patient denies concerns today. Stable at this time. TSH and T4 drawn today, will adjust medication as needed. Continue with current treatment plan.

## 2023-08-27 LAB — CMP14+EGFR
ALT: 10 IU/L (ref 0–32)
AST: 14 IU/L (ref 0–40)
Albumin: 4.2 g/dL (ref 3.8–4.8)
Alkaline Phosphatase: 68 IU/L (ref 44–121)
BUN/Creatinine Ratio: 15 (ref 12–28)
BUN: 12 mg/dL (ref 8–27)
Bilirubin Total: 0.3 mg/dL (ref 0.0–1.2)
CO2: 24 mmol/L (ref 20–29)
Calcium: 9.7 mg/dL (ref 8.7–10.3)
Chloride: 101 mmol/L (ref 96–106)
Creatinine, Ser: 0.79 mg/dL (ref 0.57–1.00)
Globulin, Total: 2.2 g/dL (ref 1.5–4.5)
Glucose: 100 mg/dL — ABNORMAL HIGH (ref 70–99)
Potassium: 4.8 mmol/L (ref 3.5–5.2)
Sodium: 139 mmol/L (ref 134–144)
Total Protein: 6.4 g/dL (ref 6.0–8.5)
eGFR: 76 mL/min/1.73 (ref >59)

## 2023-08-27 LAB — TSH+FREE T4
Free T4: 1.29 ng/dL (ref 0.82–1.77)
TSH: 0.866 u[IU]/mL (ref 0.450–4.500)

## 2023-08-27 LAB — HEMOGLOBIN A1C
Est. average glucose Bld gHb Est-mCnc: 117 mg/dL
Hgb A1c MFr Bld: 5.7 % — ABNORMAL HIGH (ref 4.8–5.6)

## 2023-09-11 DIAGNOSIS — Z85828 Personal history of other malignant neoplasm of skin: Secondary | ICD-10-CM | POA: Diagnosis not present

## 2023-09-11 DIAGNOSIS — Z08 Encounter for follow-up examination after completed treatment for malignant neoplasm: Secondary | ICD-10-CM | POA: Diagnosis not present

## 2023-10-23 ENCOUNTER — Other Ambulatory Visit: Payer: Self-pay | Admitting: Family Medicine

## 2023-11-25 ENCOUNTER — Other Ambulatory Visit: Payer: Self-pay | Admitting: Family Medicine

## 2024-01-31 ENCOUNTER — Encounter (INDEPENDENT_AMBULATORY_CARE_PROVIDER_SITE_OTHER): Payer: Medicare Other | Admitting: Ophthalmology

## 2024-01-31 DIAGNOSIS — H43813 Vitreous degeneration, bilateral: Secondary | ICD-10-CM | POA: Diagnosis not present

## 2024-01-31 DIAGNOSIS — D3131 Benign neoplasm of right choroid: Secondary | ICD-10-CM | POA: Diagnosis not present

## 2024-02-19 DIAGNOSIS — H18451 Nodular corneal degeneration, right eye: Secondary | ICD-10-CM | POA: Diagnosis not present

## 2024-02-19 DIAGNOSIS — H2513 Age-related nuclear cataract, bilateral: Secondary | ICD-10-CM | POA: Diagnosis not present

## 2024-02-19 DIAGNOSIS — H11052 Peripheral pterygium, progressive, left eye: Secondary | ICD-10-CM | POA: Diagnosis not present

## 2024-02-19 DIAGNOSIS — H2511 Age-related nuclear cataract, right eye: Secondary | ICD-10-CM | POA: Diagnosis not present

## 2024-02-19 DIAGNOSIS — H25013 Cortical age-related cataract, bilateral: Secondary | ICD-10-CM | POA: Diagnosis not present

## 2024-02-24 ENCOUNTER — Ambulatory Visit: Payer: Medicare Other | Admitting: Family Medicine

## 2024-03-11 DIAGNOSIS — Z08 Encounter for follow-up examination after completed treatment for malignant neoplasm: Secondary | ICD-10-CM | POA: Diagnosis not present

## 2024-03-11 DIAGNOSIS — Z85828 Personal history of other malignant neoplasm of skin: Secondary | ICD-10-CM | POA: Diagnosis not present

## 2024-03-11 DIAGNOSIS — L570 Actinic keratosis: Secondary | ICD-10-CM | POA: Diagnosis not present

## 2024-03-11 DIAGNOSIS — X32XXXD Exposure to sunlight, subsequent encounter: Secondary | ICD-10-CM | POA: Diagnosis not present

## 2024-03-12 ENCOUNTER — Encounter: Payer: Self-pay | Admitting: Family Medicine

## 2024-03-12 ENCOUNTER — Ambulatory Visit (INDEPENDENT_AMBULATORY_CARE_PROVIDER_SITE_OTHER): Admitting: Family Medicine

## 2024-03-12 VITALS — BP 140/70 | HR 79 | Temp 97.7°F | Ht 63.5 in | Wt 159.0 lb

## 2024-03-12 DIAGNOSIS — E785 Hyperlipidemia, unspecified: Secondary | ICD-10-CM

## 2024-03-12 DIAGNOSIS — E039 Hypothyroidism, unspecified: Secondary | ICD-10-CM

## 2024-03-12 DIAGNOSIS — R7303 Prediabetes: Secondary | ICD-10-CM

## 2024-03-12 DIAGNOSIS — I10 Essential (primary) hypertension: Secondary | ICD-10-CM

## 2024-03-12 MED ORDER — ROSUVASTATIN CALCIUM 5 MG PO TABS: ORAL_TABLET | ORAL | 3 refills | Status: DC

## 2024-03-12 MED ORDER — ENALAPRIL MALEATE 5 MG PO TABS: 5.0000 mg | ORAL_TABLET | Freq: Every day | ORAL | 3 refills | Status: DC

## 2024-03-12 MED ORDER — LEVOTHYROXINE SODIUM 75 MCG PO TABS: ORAL_TABLET | ORAL | 3 refills | Status: DC

## 2024-03-12 NOTE — Patient Instructions (Signed)
Labs at your convenience.  Follow up in 6 months.  Take care  Dr. Adriana Simas

## 2024-03-13 ENCOUNTER — Ambulatory Visit: Payer: Self-pay | Admitting: Family Medicine

## 2024-03-13 LAB — CMP14+EGFR
ALT: 12 IU/L (ref 0–32)
AST: 16 IU/L (ref 0–40)
Albumin: 4 g/dL (ref 3.8–4.8)
Alkaline Phosphatase: 73 IU/L (ref 44–121)
BUN/Creatinine Ratio: 14 (ref 12–28)
BUN: 11 mg/dL (ref 8–27)
Bilirubin Total: 0.3 mg/dL (ref 0.0–1.2)
CO2: 22 mmol/L (ref 20–29)
Calcium: 9.3 mg/dL (ref 8.7–10.3)
Chloride: 97 mmol/L (ref 96–106)
Creatinine, Ser: 0.78 mg/dL (ref 0.57–1.00)
Globulin, Total: 2.6 g/dL (ref 1.5–4.5)
Glucose: 103 mg/dL — ABNORMAL HIGH (ref 70–99)
Potassium: 5.2 mmol/L (ref 3.5–5.2)
Sodium: 133 mmol/L — ABNORMAL LOW (ref 134–144)
Total Protein: 6.6 g/dL (ref 6.0–8.5)
eGFR: 77 mL/min/1.73 (ref >59)

## 2024-03-13 LAB — HEMOGLOBIN A1C
Est. average glucose Bld gHb Est-mCnc: 111 mg/dL
Hgb A1c MFr Bld: 5.5 % (ref 4.8–5.6)

## 2024-03-13 LAB — LIPID PANEL
Chol/HDL Ratio: 3.3 ratio (ref 0.0–4.4)
Cholesterol, Total: 176 mg/dL (ref 100–199)
HDL: 54 mg/dL (ref >39)
LDL Chol Calc (NIH): 102 mg/dL — ABNORMAL HIGH (ref 0–99)
Triglycerides: 111 mg/dL (ref 0–149)
VLDL Cholesterol Cal: 20 mg/dL (ref 5–40)

## 2024-03-13 LAB — TSH: TSH: 1.18 u[IU]/mL (ref 0.450–4.500)

## 2024-03-13 NOTE — Progress Notes (Signed)
 Subjective:  Patient ID: Meghan Ortiz, female    DOB: [birth year]  Age: 83 y.o. MRN: [id number]  CC:   Chief Complaint  Patient presents with   Hypertension    6 month Follow up     HPI:  83 year old female with hypertension, hypothyroidism, osteoporosis, hyperlipidemia, prediabetes presents for follow-up.  Patient tolerating Boniva.  This is managed by her OB/GYN.  Patient believes that she is coming up on 5 years of treatment.  Hypertension fairly well-controlled given age on enalapril .  Hypothyroidism has been stable on current dose of levothyroxine .  Needs labs today.    Denies chest pain or shortness of breath.  She is feeling well.  Patient Active Problem List   Diagnosis Date Noted   Healthcare maintenance 07/23/2022   Osteoporosis 01/16/2022   Prediabetes 03/20/2018   Essential hypertension 01/22/2018   Hyperlipidemia 01/22/2018   Hypothyroidism 01/22/2018    Social Hx   Social History   Socioeconomic History   Marital status: Widowed    Spouse name: Not on file   Number of children: Not on file   Years of education: Not on file   Highest education level: Not on file  Occupational History   Not on file  Tobacco Use   Smoking status: Never   Smokeless tobacco: Never  Vaping Use   Vaping status: Never Used  Substance and Sexual Activity   Alcohol use: Never   Drug use: Never   Sexual activity: Not Currently    Birth control/protection: Post-menopausal  Other Topics Concern   Not on file  Social History Narrative   Not on file   Social Drivers of Health   Financial Resource Strain: Low Risk  (06/28/2023)   Overall Financial Resource Strain (CARDIA)    Difficulty of Paying Living Expenses: Not hard at all  Food Insecurity: No Food Insecurity (06/28/2023)   Hunger Vital Sign    Worried About Running Out of Food in the Last Year: Never true    Ran Out of Food in the Last Year: Never true  Transportation Needs: No Transportation Needs  (06/28/2023)   PRAPARE - Administrator, Civil Service (Medical): No    Lack of Transportation (Non-Medical): No  Physical Activity: Insufficiently Active (06/28/2023)   Exercise Vital Sign    Days of Exercise per Week: 3 days    Minutes of Exercise per Session: 30 min  Stress: No Stress Concern Present (06/28/2023)   [employer] of Occupational Health - Occupational Stress Questionnaire    Feeling of Stress : Not at all  Social Connections: Moderately Isolated (06/28/2023)   Social Connection and Isolation Panel    Frequency of Communication with Friends and Family: More than three times a week    Frequency of Social Gatherings with Friends and Family: Three times a week    Attends Religious Services: More than 4 times per year    Active Member of Clubs or Organizations: No    Attends Banker Meetings: Never    Marital Status: Widowed    Review of Systems Per HPI  Objective:  BP (!) 140/70   Pulse 79   Temp 97.7 F (36.5 C)   Ht 5' 3.5 (1.613 m)   Wt 159 lb (72.1 kg)   SpO2 96%   BMI 27.72 kg/m      03/12/2024   10:10 AM 08/26/2023   11:51 AM 08/26/2023   11:23 AM  BP/Weight  Systolic BP 140 134 149  Diastolic BP 70 73 7  Wt. (Lbs) 159  159  BMI 27.72 kg/m2  27.72 kg/m2    Physical Exam Vitals and nursing note reviewed.  Constitutional:      General: She is not in acute distress.    Appearance: Normal appearance.  HENT:     Head: Normocephalic and atraumatic.  Eyes:     General:        Right eye: No discharge.        Left eye: No discharge.     Conjunctiva/sclera: Conjunctivae normal.  Cardiovascular:     Rate and Rhythm: Normal rate and regular rhythm.  Pulmonary:     Effort: Pulmonary effort is normal.     Breath sounds: Normal breath sounds. No wheezing, rhonchi or rales.  Neurological:     Mental Status: She is alert.  Psychiatric:        Mood and Affect: Mood normal.        Behavior: Behavior normal.     Lab  Results  Component Value Date   WBC 7.3 01/16/2022   HGB 13.4 01/16/2022   HCT 39.4 01/16/2022   PLT 374 01/16/2022   GLUCOSE 103 (H) 03/12/2024   CHOL 176 03/12/2024   TRIG 111 03/12/2024   HDL 54 03/12/2024   LDLCALC 102 (H) 03/12/2024   ALT 12 03/12/2024   AST 16 03/12/2024   NA 133 (L) 03/12/2024   K 5.2 03/12/2024   CL 97 03/12/2024   CREATININE 0.78 03/12/2024   BUN 11 03/12/2024   CO2 22 03/12/2024   TSH 1.180 03/12/2024   HGBA1C 5.5 03/12/2024     Assessment & Plan:  Essential hypertension Assessment & Plan: Stable.  Medication refilled.  Orders: -     Enalapril  Maleate; Take 1 tablet (5 mg total) by mouth daily.  Dispense: 90 tablet; Refill: 3  Prediabetes -     CMP14+EGFR -     Hemoglobin A1c  Hyperlipidemia, unspecified hyperlipidemia type Assessment & Plan: Continue statin.  Orders: -     Lipid panel -     Rosuvastatin  Calcium ; TAKE ONE TABLET (5MG  TOTAL) BY MOUTH MONDAYS, WEDNESDAYS, AND FRIDAYS FOR CHOLESTEROL  Dispense: 90 tablet; Refill: 3  Hypothyroidism, unspecified type Assessment & Plan: TSH returned normal.  Continue current dose only thyroxine.  Refilled.  Orders: -     TSH -     Levothyroxine  Sodium; TAKE ONE TABLET (75 MCG) BY MOUTH DAILY MONDAY THROUGH SATURDAY IN THE MORNING ON AN EMPTY STOMACH WITH WATER. NO DOSE ON SUNDAY.  Dispense: 90 tablet; Refill: 3    Follow-up:  6 months  Daleysa Kristiansen Bluford DO [facility]

## 2024-03-13 NOTE — Assessment & Plan Note (Signed)
 Continue statin.

## 2024-03-13 NOTE — Assessment & Plan Note (Signed)
 TSH returned normal.  Continue current dose only thyroxine.  Refilled.

## 2024-03-13 NOTE — Assessment & Plan Note (Signed)
Stable.  Medication refilled 

## 2024-03-27 HISTORY — PX: EYE SURGERY: SHX253

## 2024-03-31 DIAGNOSIS — I10 Essential (primary) hypertension: Secondary | ICD-10-CM | POA: Diagnosis not present

## 2024-03-31 DIAGNOSIS — H268 Other specified cataract: Secondary | ICD-10-CM | POA: Diagnosis not present

## 2024-03-31 DIAGNOSIS — E039 Hypothyroidism, unspecified: Secondary | ICD-10-CM | POA: Diagnosis not present

## 2024-03-31 DIAGNOSIS — H2511 Age-related nuclear cataract, right eye: Secondary | ICD-10-CM | POA: Diagnosis not present

## 2024-04-17 DIAGNOSIS — I10 Essential (primary) hypertension: Secondary | ICD-10-CM | POA: Diagnosis not present

## 2024-04-17 DIAGNOSIS — H11022 Central pterygium of left eye: Secondary | ICD-10-CM | POA: Diagnosis not present

## 2024-04-17 DIAGNOSIS — H25012 Cortical age-related cataract, left eye: Secondary | ICD-10-CM | POA: Diagnosis not present

## 2024-04-18 DIAGNOSIS — H18792 Other corneal deformities, left eye: Secondary | ICD-10-CM | POA: Diagnosis not present

## 2024-05-18 DIAGNOSIS — H18792 Other corneal deformities, left eye: Secondary | ICD-10-CM | POA: Diagnosis not present

## 2024-05-18 DIAGNOSIS — H11022 Central pterygium of left eye: Secondary | ICD-10-CM | POA: Diagnosis not present

## 2024-06-23 DIAGNOSIS — D3131 Benign neoplasm of right choroid: Secondary | ICD-10-CM | POA: Diagnosis not present

## 2024-09-03 ENCOUNTER — Other Ambulatory Visit (HOSPITAL_COMMUNITY): Payer: Self-pay | Admitting: Obstetrics and Gynecology

## 2024-09-03 DIAGNOSIS — Z1231 Encounter for screening mammogram for malignant neoplasm of breast: Secondary | ICD-10-CM

## 2024-09-11 ENCOUNTER — Ambulatory Visit

## 2024-09-11 VITALS — Ht 63.5 in | Wt 159.0 lb

## 2024-09-11 DIAGNOSIS — Z0000 Encounter for general adult medical examination without abnormal findings: Secondary | ICD-10-CM

## 2024-09-11 NOTE — Progress Notes (Signed)
 "  Chief Complaint  Patient presents with   Medicare Wellness     Subjective:   Meghan Ortiz is a 84 y.o. female who presents for a Medicare Annual Wellness Visit.  Visit info / Clinical Intake: Medicare Wellness Visit Type:: Subsequent Annual Wellness Visit Persons participating in visit and providing information:: patient Medicare Wellness Visit Mode:: Telephone If telephone:: video declined Since this visit was completed virtually, some vitals may be partially provided or unavailable. Missing vitals are due to the limitations of the virtual format.: Documented vitals are patient reported If Telephone or Video please confirm:: I connected with patient using audio/video enable telemedicine. I verified patient identity with two identifiers, discussed telehealth limitations, and patient agreed to proceed. Patient Location:: home Provider Location:: office Interpreter Needed?: No Pre-visit prep was completed: yes AWV questionnaire completed by patient prior to visit?: no Living arrangements:: (!) lives alone Patient's Overall Health Status Rating: good Typical amount of pain: some Does pain affect daily life?: no Are you currently prescribed opioids?: no  Dietary Habits and Nutritional Risks How many meals a day?: 3 Eats fruit and vegetables daily?: yes Most meals are obtained by: preparing own meals In the last 2 weeks, have you had any of the following?: none Diabetic:: no  Functional Status Activities of Daily Living (to include ambulation/medication): Independent Ambulation: Independent Medication Administration: Independent Home Management (perform basic housework or laundry): Independent Manage your own finances?: yes Primary transportation is: driving Concerns about vision?: no *vision screening is required for WTM* Concerns about hearing?: no  Fall Screening Falls in the past year?: 0 Number of falls in past year: 0 Was there an injury with Fall?: 0 Fall Risk  Category Calculator: 0 Patient Fall Risk Level: Low Fall Risk  Fall Risk Patient at Risk for Falls Due to: Impaired vision Fall risk Follow up: Falls evaluation completed; Education provided; Falls prevention discussed  Home and Transportation Safety: All rugs have non-skid backing?: yes All stairs or steps have railings?: yes Grab bars in the bathtub or shower?: yes Have non-skid surface in bathtub or shower?: yes Good home lighting?: yes Regular seat belt use?: yes Hospital stays in the last year:: no  Cognitive Assessment Difficulty concentrating, remembering, or making decisions? : no Will 6CIT or Mini Cog be Completed: no 6CIT or Mini Cog Declined: patient alert, oriented, able to answer questions appropriately and recall recent events  Advance Directives (For Healthcare) Does Patient Have a Medical Advance Directive?: No Would patient like information on creating a medical advance directive?: Yes (MAU/Ambulatory/Procedural Areas - Information given)  Reviewed/Updated  Reviewed/Updated: Reviewed All (Medical, Surgical, Family, Medications, Allergies, Care Teams, Patient Goals)    Allergies (verified) Patient has no known allergies.   Current Medications (verified) Outpatient Encounter Medications as of 09/11/2024  Medication Sig   calcium  carbonate (OSCAL) 1500 (600 Ca) MG TABS tablet Take by mouth.   Cholecalciferol 50 MCG (2000 UT) TABS Take by mouth.   enalapril  (VASOTEC ) 5 MG tablet Take 1 tablet (5 mg total) by mouth daily.   ibandronate (BONIVA) 150 MG tablet Take 150 mg by mouth every 30 (thirty) days.   lansoprazole (PREVACID) 15 MG capsule Take by mouth.   levothyroxine  (SYNTHROID ) 75 MCG tablet TAKE ONE TABLET (75 MCG) BY MOUTH DAILY MONDAY THROUGH SATURDAY IN THE MORNING ON AN EMPTY STOMACH WITH WATER. NO DOSE ON SUNDAY.   oxybutynin (DITROPAN-XL) 10 MG 24 hr tablet Take 10 mg by mouth at bedtime.   rosuvastatin  (CRESTOR ) 5 MG tablet TAKE  ONE TABLET (5MG   TOTAL) BY MOUTH MONDAYS, WEDNESDAYS, AND FRIDAYS FOR CHOLESTEROL   venlafaxine XR (EFFEXOR-XR) 75 MG 24 hr capsule Take by mouth.   vitamin B-12 (CYANOCOBALAMIN) 500 MCG tablet Take 500 mcg by mouth daily.   VITAMIN E PO Take by mouth.   XIIDRA 5 % SOLN    No facility-administered encounter medications on file as of 09/11/2024.    History: Past Medical History:  Diagnosis Date   Hyperlipidemia    Hypertension    Thyroid  disease    Past Surgical History:  Procedure Laterality Date   ABDOMINAL HYSTERECTOMY     1970s   BLADDER REPAIR     1980s   EYE SURGERY  03/2024   Dr. Meridee   Family History  Problem Relation Age of Onset   Cancer Mother    Memory loss Mother    Cancer Father    Cancer Sister    Cancer Sister    Cancer Sister    Cancer Sister    Cancer Brother    Social History   Occupational History   Not on file  Tobacco Use   Smoking status: Never   Smokeless tobacco: Never  Vaping Use   Vaping status: Never Used  Substance and Sexual Activity   Alcohol use: Never   Drug use: Never   Sexual activity: Not Currently    Birth control/protection: Post-menopausal   Tobacco Counseling Counseling given: Not Answered  SDOH Screenings   Food Insecurity: No Food Insecurity (09/11/2024)  Housing: Low Risk (09/11/2024)  Transportation Needs: No Transportation Needs (09/11/2024)  Utilities: Not At Risk (09/11/2024)  Alcohol Screen: Low Risk (06/28/2023)  Depression (PHQ2-9): Low Risk (09/11/2024)  Financial Resource Strain: Low Risk (06/28/2023)  Physical Activity: Insufficiently Active (09/11/2024)  Social Connections: Moderately Isolated (09/11/2024)  Stress: No Stress Concern Present (09/11/2024)  Tobacco Use: Low Risk (09/11/2024)  Health Literacy: Adequate Health Literacy (09/11/2024)   See flowsheets for full screening details  Depression Screen PHQ 2 & 9 Depression Scale- Over the past 2 weeks, how often have you been bothered by any of the following  problems? Little interest or pleasure in doing things: 0 Feeling down, depressed, or hopeless (PHQ Adolescent also includes...irritable): 0 PHQ-2 Total Score: 0 Trouble falling or staying asleep, or sleeping too much: 0 Feeling tired or having little energy: 0 Poor appetite or overeating (PHQ Adolescent also includes...weight loss): 0 Feeling bad about yourself - or that you are a failure or have let yourself or your family down: 0 Trouble concentrating on things, such as reading the newspaper or watching television (PHQ Adolescent also includes...like school work): 0 Moving or speaking so slowly that other people could have noticed. Or the opposite - being so fidgety or restless that you have been moving around a lot more than usual: 0 Thoughts that you would be better off dead, or of hurting yourself in some way: 0 PHQ-9 Total Score: 0 If you checked off any problems, how difficult have these problems made it for you to do your work, take care of things at home, or get along with other people?: Not difficult at all     Goals Addressed             This Visit's Progress    Remain active and independent   On track            Objective:    Today's Vitals   09/11/24 0923  Weight: 159 lb (72.1 kg)  Height: 5' 3.5 (  1.613 m)   Body mass index is 27.72 kg/m.  Hearing/Vision screen Hearing Screening - Comments:: Patient is able to hear conversational tones without difficulty. No issues reported.   Immunizations and Health Maintenance Health Maintenance  Topic Date Due   DTaP/Tdap/Td (1 - Tdap) Never done   Influenza Vaccine  03/27/2024   Medicare Annual Wellness (AWV)  09/11/2025   Pneumococcal Vaccine: 50+ Years  Completed   Bone Density Scan  Completed   Meningococcal B Vaccine  Aged Out   Colonoscopy  Discontinued   COVID-19 Vaccine  Discontinued   Zoster Vaccines- Shingrix  Discontinued        Assessment/Plan:  This is a routine wellness examination for  [place].  Patient Care Team: Cook, Jayce G, DO as PCP - General (Family Medicine) Shona Rush, MD (Dermatology) Leva Rush, MD as Consulting Physician (Obstetrics and Gynecology) Alvia Rush BIRCH, MD as Consulting Physician (Ophthalmology) Meridee Seltzer, MD as Consulting Physician (Ophthalmology)  I have personally reviewed and noted the following in the patients chart:   Medical and social history Use of alcohol, tobacco or illicit drugs  Current medications and supplements including opioid prescriptions. Functional ability and status Nutritional status Physical activity Advanced directives List of other physicians Hospitalizations, surgeries, and ER visits in previous 12 months Vitals Screenings to include cognitive, depression, and falls Referrals and appointments  No orders of the defined types were placed in this encounter.  In addition, I have reviewed and discussed with patient certain preventive protocols, quality metrics, and best practice recommendations. A written personalized care plan for preventive services as well as general preventive health recommendations were provided to patient.   Lavelle Charmaine Browner, LPN   [id number]/83/7973   Return in 1 year (on 09/11/2025).  After Visit Summary: (Pick Up) Due to this being a telephonic visit, with patients personalized plan was offered to patient and patient has requested to Pick up at office.  Nurse Notes: Patient advised to keep follow-up appointment with PCP (09/14/24 @ 10:20)  "

## 2024-09-11 NOTE — Patient Instructions (Signed)
 Meghan Ortiz,  Thank you for taking the time for your Medicare Wellness Visit. I appreciate your continued commitment to your health goals. Please review the care plan we discussed, and feel free to reach out if I can assist you further.  Please note that Annual Wellness Visits do not include a physical exam. Some assessments may be limited, especially if the visit was conducted virtually. If needed, we may recommend an in-person follow-up with your provider.  Ongoing Care Seeing your primary care provider every 3 to 6 months helps us  monitor your health and provide consistent, personalized care.   Referrals If a referral was made during today's visit and you haven't received any updates within two weeks, please contact the referred provider directly to check on the status.  Recommended Screenings:  Health Maintenance  Topic Date Due   DTaP/Tdap/Td vaccine (1 - Tdap) Never done   Flu Shot  03/27/2024   Medicare Annual Wellness Visit  09/11/2025   Pneumococcal Vaccine for age over 77  Completed   Osteoporosis screening with Bone Density Scan  Completed   Meningitis B Vaccine  Aged Out   Colon Cancer Screening  Discontinued   COVID-19 Vaccine  Discontinued   Zoster (Shingles) Vaccine  Discontinued       09/11/2024    9:39 AM  Advanced Directives  Does Patient Have a Medical Advance Directive? No  Would patient like information on creating a medical advance directive? Yes (MAU/Ambulatory/Procedural Areas - Information given)   Information on Advanced Care Planning can be found at   [facility] Advance Health Care Directives Advance Health Care Directives ([email or web address]    Vision: Annual vision screenings are recommended for early detection of glaucoma, cataracts, and diabetic retinopathy. These exams can also reveal signs of chronic conditions such as diabetes and high blood pressure.  Dental: Annual dental screenings help detect early signs of oral cancer, gum  disease, and other conditions linked to overall health, including heart disease and diabetes.  Please see the attached documents for additional preventive care recommendations.

## 2024-09-14 ENCOUNTER — Encounter: Payer: Self-pay | Admitting: Family Medicine

## 2024-09-14 ENCOUNTER — Ambulatory Visit: Admitting: Family Medicine

## 2024-09-14 VITALS — BP 134/72 | HR 80 | Temp 98.1°F | Ht 63.5 in | Wt 160.6 lb

## 2024-09-14 DIAGNOSIS — I10 Essential (primary) hypertension: Secondary | ICD-10-CM | POA: Diagnosis not present

## 2024-09-14 DIAGNOSIS — E785 Hyperlipidemia, unspecified: Secondary | ICD-10-CM | POA: Diagnosis not present

## 2024-09-14 DIAGNOSIS — Z23 Encounter for immunization: Secondary | ICD-10-CM | POA: Diagnosis not present

## 2024-09-14 DIAGNOSIS — E039 Hypothyroidism, unspecified: Secondary | ICD-10-CM

## 2024-09-14 MED ORDER — ROSUVASTATIN CALCIUM 5 MG PO TABS: ORAL_TABLET | ORAL | 3 refills | Status: DC

## 2024-09-14 MED ORDER — ENALAPRIL MALEATE 5 MG PO TABS: 5.0000 mg | ORAL_TABLET | Freq: Every day | ORAL | 3 refills | Status: AC

## 2024-09-14 MED ORDER — LEVOTHYROXINE SODIUM 75 MCG PO TABS: ORAL_TABLET | ORAL | 3 refills | Status: AC

## 2024-09-14 NOTE — Assessment & Plan Note (Signed)
 Stable.  Continue current dosing levothyroxine.

## 2024-09-14 NOTE — Progress Notes (Signed)
 "  Subjective:  Patient ID: Meghan Ortiz, female    DOB: [birth year]  Age: 84 y.o. MRN: [id number]  CC:   Chief Complaint  Patient presents with   Follow-up    No C/O    HPI:  84 year old female presents for follow-up.  Patient states that she is feeling well.  No chest pain or shortness of breath.    Patient states that she would like to get her flu shot today.  Needs labs.  Hypertension stable on enalapril .  No reported side effects from Crestor .  Remains on Boniva for osteoporosis.  Follows with OB/GYN regarding this.  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    Social Hx   Social History   Socioeconomic History   Marital status: Widowed    Spouse name: Not on file   Number of children: Not on file   Years of education: Not on file   Highest education level: Not on file  Occupational History   Not on file  Tobacco Use   Smoking status: Never   Smokeless tobacco: Never  Vaping Use   Vaping status: Never Used  Substance and Sexual Activity   Alcohol use: Never   Drug use: Never   Sexual activity: Not Currently    Birth control/protection: Post-menopausal  Other Topics Concern   Not on file  Social History Narrative   Not on file   Social Drivers of Health   Tobacco Use: Low Risk (09/14/2024)   Patient History    Smoking Tobacco Use: Never    Smokeless Tobacco Use: Never    Passive Exposure: Not on file  Financial Resource Strain: Low Risk (06/28/2023)   Overall Financial Resource Strain (CARDIA)    Difficulty of Paying Living Expenses: Not hard at all  Food Insecurity: No Food Insecurity (09/11/2024)   Epic    Worried About Programme Researcher, Broadcasting/film/video in the Last Year: Never true    Ran Out of Food in the Last Year: Never  true  Transportation Needs: No Transportation Needs (09/11/2024)   Epic    Lack of Transportation (Medical): No    Lack of Transportation (Non-Medical): No  Physical Activity: Insufficiently Active (09/11/2024)   Exercise Vital Sign    Days of Exercise per Week: 3 days    Minutes of Exercise per Session: 30 min  Stress: No Stress Concern Present (09/11/2024)   [employer] of Occupational Health - Occupational Stress Questionnaire    Feeling of Stress: Not at all  Social Connections: Moderately Isolated (09/11/2024)   Social Connection and Isolation Panel    Frequency of Communication with Friends and Family: More than three times a week    Frequency of Social Gatherings with Friends and Family: Three times a week    Attends Religious Services: More than 4 times per year    Active Member of Clubs or Organizations: No    Attends Banker Meetings: Never    Marital Status: Widowed  Depression (PHQ2-9): Low Risk (09/11/2024)   Depression (PHQ2-9)    PHQ-2 Score: 0  Alcohol Screen: Low Risk (06/28/2023)   Alcohol Screen    Last Alcohol Screening Score (AUDIT): 0  Housing: Low Risk (09/11/2024)   Epic    Unable to Pay for Housing in the Last Year: No  Number of Times Moved in the Last Year: 0    Homeless in the Last Year: No  Utilities: Not At Risk (09/11/2024)   Epic    Threatened with loss of utilities: No  Health Literacy: Adequate Health Literacy (09/11/2024)   B1300 Health Literacy    Frequency of need for help with medical instructions: Never    Review of Systems Per HPI  Objective:  BP 134/72   Pulse 80   Temp 98.1 F (36.7 C)   Ht 5' 3.5 (1.613 m)   Wt 160 lb 9.6 oz (72.8 kg)   SpO2 96%   BMI 28.00 kg/m      09/14/2024   10:42 AM 09/11/2024    9:23 AM 03/12/2024   10:10 AM  BP/Weight  Systolic BP 134 -- 140  Diastolic BP 72 -- 70  Wt. (Lbs) 160.6 159 159  BMI 28 kg/m2 27.72 kg/m2 27.72 kg/m2    Physical Exam Vitals and nursing note  reviewed.  Constitutional:      General: She is not in acute distress.    Appearance: Normal appearance.  HENT:     Head: Normocephalic and atraumatic.  Eyes:     General:        Right eye: No discharge.        Left eye: No discharge.     Conjunctiva/sclera: Conjunctivae normal.  Cardiovascular:     Rate and Rhythm: Normal rate and regular rhythm.  Pulmonary:     Effort: Pulmonary effort is normal.     Breath sounds: Normal breath sounds. No wheezing, rhonchi or rales.  Neurological:     Mental Status: She is alert.  Psychiatric:        Mood and Affect: Mood normal.        Behavior: Behavior normal.     Lab Results  Component Value Date   WBC 7.3 01/16/2022   HGB 13.4 01/16/2022   HCT 39.4 01/16/2022   PLT 374 01/16/2022   GLUCOSE 103 (H) 03/12/2024   CHOL 176 03/12/2024   TRIG 111 03/12/2024   HDL 54 03/12/2024   LDLCALC 102 (H) 03/12/2024   ALT 12 03/12/2024   AST 16 03/12/2024   NA 133 (L) 03/12/2024   K 5.2 03/12/2024   CL 97 03/12/2024   CREATININE 0.78 03/12/2024   BUN 11 03/12/2024   CO2 22 03/12/2024   TSH 1.180 03/12/2024   HGBA1C 5.5 03/12/2024     Assessment & Plan:  Essential hypertension Assessment & Plan: Stable.  Continue enalapril .  Orders: -     CMP14+EGFR -     Microalbumin / creatinine urine ratio -     Enalapril  Maleate; Take 1 tablet (5 mg total) by mouth daily.  Dispense: 90 tablet; Refill: 3  Need for vaccination -     Flu vaccine HIGH DOSE PF(Fluzone Trivalent)  Hyperlipidemia, unspecified hyperlipidemia type Assessment & Plan: Lipid panel to assess.  Continue Crestor .  Orders: -     Lipid panel -     Rosuvastatin  Calcium ; TAKE ONE TABLET (5MG  TOTAL) BY MOUTH MONDAYS, WEDNESDAYS, AND FRIDAYS FOR CHOLESTEROL  Dispense: 90 tablet; Refill: 3  Hypothyroidism, unspecified type Assessment & Plan: Stable.  Continue current dosing levothyroxine .  Orders: -     TSH -     Levothyroxine  Sodium; TAKE ONE TABLET (75 MCG) BY MOUTH  DAILY MONDAY THROUGH SATURDAY IN THE MORNING ON AN EMPTY STOMACH WITH WATER. NO DOSE ON SUNDAY.  Dispense: 90 tablet; Refill: 3  Follow-up: 6 months  Jeson Camacho Bluford DO [facility] "

## 2024-09-14 NOTE — Patient Instructions (Signed)
Follow up in 6 months.  Take care  Dr. Wren Gallaga  

## 2024-09-14 NOTE — Assessment & Plan Note (Signed)
Lipid panel to assess.  Continue Crestor.

## 2024-09-14 NOTE — Assessment & Plan Note (Signed)
Stable.  Continue enalapril.

## 2024-09-15 ENCOUNTER — Ambulatory Visit: Payer: Self-pay | Admitting: Family Medicine

## 2024-09-15 LAB — LIPID PANEL
Chol/HDL Ratio: 3.3 ratio (ref 0.0–4.4)
Cholesterol, Total: 205 mg/dL — ABNORMAL HIGH (ref 100–199)
HDL: 63 mg/dL
LDL Chol Calc (NIH): 115 mg/dL — ABNORMAL HIGH (ref 0–99)
Triglycerides: 158 mg/dL — ABNORMAL HIGH (ref 0–149)
VLDL Cholesterol Cal: 27 mg/dL (ref 5–40)

## 2024-09-15 LAB — CMP14+EGFR
ALT: 10 IU/L (ref 0–32)
AST: 13 IU/L (ref 0–40)
Albumin: 4.1 g/dL (ref 3.7–4.7)
Alkaline Phosphatase: 71 IU/L (ref 48–129)
BUN/Creatinine Ratio: 16 (ref 12–28)
BUN: 11 mg/dL (ref 8–27)
Bilirubin Total: 0.3 mg/dL (ref 0.0–1.2)
CO2: 23 mmol/L (ref 20–29)
Calcium: 9.6 mg/dL (ref 8.7–10.3)
Chloride: 99 mmol/L (ref 96–106)
Creatinine, Ser: 0.7 mg/dL (ref 0.57–1.00)
Globulin, Total: 2.3 g/dL (ref 1.5–4.5)
Glucose: 101 mg/dL — ABNORMAL HIGH (ref 70–99)
Potassium: 5.3 mmol/L — ABNORMAL HIGH (ref 3.5–5.2)
Sodium: 137 mmol/L (ref 134–144)
Total Protein: 6.4 g/dL (ref 6.0–8.5)
eGFR: 87 mL/min/1.73

## 2024-09-15 LAB — MICROALBUMIN / CREATININE URINE RATIO
Creatinine, Urine: 73.2 mg/dL
Microalb/Creat Ratio: <4 mg/g{creat} (ref 0–29)
Microalbumin, Urine: <3 ug/mL

## 2024-09-15 LAB — TSH: TSH: 1.14 u[IU]/mL (ref 0.450–4.500)

## 2024-09-16 ENCOUNTER — Encounter (HOSPITAL_COMMUNITY): Payer: Self-pay

## 2024-09-16 ENCOUNTER — Ambulatory Visit (HOSPITAL_COMMUNITY)
Admission: RE | Admit: 2024-09-16 | Discharge: 2024-09-16 | Disposition: A | Discharge status: Home / Self Care | Source: Ambulatory Visit | Attending: Obstetrics and Gynecology | Admitting: Obstetrics and Gynecology

## 2024-09-16 DIAGNOSIS — Z1231 Encounter for screening mammogram for malignant neoplasm of breast: Secondary | ICD-10-CM | POA: Insufficient documentation

## 2024-09-22 ENCOUNTER — Other Ambulatory Visit: Payer: Self-pay | Admitting: Family Medicine

## 2024-09-22 DIAGNOSIS — E785 Hyperlipidemia, unspecified: Secondary | ICD-10-CM

## 2024-09-22 MED ORDER — ROSUVASTATIN CALCIUM 10 MG PO TABS: 10.0000 mg | ORAL_TABLET | Freq: Every day | ORAL | 3 refills | Status: AC

## 2024-09-22 NOTE — Progress Notes (Signed)
 Spoke to patient, understanding of lab results, is also willing to do treatment for the Lipids, pharmacy is [facility]. Please advise which medications will be sent so I can let her know.

## 2024-09-23 NOTE — Progress Notes (Signed)
 Advised patient of increase in crestor  for treatment of the lipids.

## 2025-01-29 ENCOUNTER — Encounter (INDEPENDENT_AMBULATORY_CARE_PROVIDER_SITE_OTHER): Admitting: Ophthalmology

## 2025-03-15 ENCOUNTER — Ambulatory Visit: Admitting: Family Medicine
